# Patient Record
Sex: Male | Born: 1960 | Marital: Married | Smoking: Never smoker
Health system: Southern US, Community
[De-identification: ages and names within clinical notes are randomized; demographics above are authoritative.]

## PROBLEM LIST (undated history)

## (undated) DIAGNOSIS — U071 COVID-19: Secondary | ICD-10-CM

## (undated) DIAGNOSIS — I482 Chronic atrial fibrillation, unspecified: Secondary | ICD-10-CM

## (undated) DIAGNOSIS — J939 Pneumothorax, unspecified: Secondary | ICD-10-CM

## (undated) DIAGNOSIS — J1282 Pneumonia due to coronavirus disease 2019: Secondary | ICD-10-CM

## (undated) DIAGNOSIS — J9621 Acute and chronic respiratory failure with hypoxia: Secondary | ICD-10-CM

## (undated) DIAGNOSIS — J189 Pneumonia, unspecified organism: Secondary | ICD-10-CM

---

## 2019-05-01 ENCOUNTER — Other Ambulatory Visit (HOSPITAL_COMMUNITY): Payer: BC Managed Care – PPO

## 2019-05-01 ENCOUNTER — Inpatient Hospital Stay (HOSPITAL_COMMUNITY)
Admission: RE | Admit: 2019-05-01 | Discharge: 2019-05-17 | Disposition: A | Discharge status: Still In Hospital | Payer: BC Managed Care – PPO | Source: Other Acute Inpatient Hospital | Attending: Internal Medicine | Admitting: Internal Medicine

## 2019-05-01 DIAGNOSIS — J1282 Pneumonia due to coronavirus disease 2019: Secondary | ICD-10-CM | POA: Diagnosis present

## 2019-05-01 DIAGNOSIS — T85598A Other mechanical complication of other gastrointestinal prosthetic devices, implants and grafts, initial encounter: Secondary | ICD-10-CM

## 2019-05-01 DIAGNOSIS — J939 Pneumothorax, unspecified: Secondary | ICD-10-CM | POA: Diagnosis present

## 2019-05-01 DIAGNOSIS — Z4659 Encounter for fitting and adjustment of other gastrointestinal appliance and device: Secondary | ICD-10-CM

## 2019-05-01 DIAGNOSIS — R079 Chest pain, unspecified: Secondary | ICD-10-CM

## 2019-05-01 DIAGNOSIS — Z931 Gastrostomy status: Secondary | ICD-10-CM

## 2019-05-01 DIAGNOSIS — J9621 Acute and chronic respiratory failure with hypoxia: Secondary | ICD-10-CM | POA: Diagnosis present

## 2019-05-01 DIAGNOSIS — U071 COVID-19: Secondary | ICD-10-CM | POA: Diagnosis present

## 2019-05-01 DIAGNOSIS — J189 Pneumonia, unspecified organism: Secondary | ICD-10-CM | POA: Diagnosis present

## 2019-05-01 DIAGNOSIS — I482 Chronic atrial fibrillation, unspecified: Secondary | ICD-10-CM | POA: Diagnosis present

## 2019-05-01 DIAGNOSIS — J9690 Respiratory failure, unspecified, unspecified whether with hypoxia or hypercapnia: Secondary | ICD-10-CM

## 2019-05-01 DIAGNOSIS — Z930 Tracheostomy status: Secondary | ICD-10-CM

## 2019-05-01 DIAGNOSIS — R0603 Acute respiratory distress: Secondary | ICD-10-CM

## 2019-05-01 HISTORY — DX: Acute and chronic respiratory failure with hypoxia: J96.21

## 2019-05-01 HISTORY — DX: Pneumonia due to coronavirus disease 2019: J12.82

## 2019-05-01 HISTORY — DX: Pneumonia, unspecified organism: J18.9

## 2019-05-01 HISTORY — DX: Pneumothorax, unspecified: J93.9

## 2019-05-01 HISTORY — DX: COVID-19: U07.1

## 2019-05-01 HISTORY — DX: Chronic atrial fibrillation, unspecified: I48.20

## 2019-05-01 LAB — BLOOD GAS, ARTERIAL
Acid-Base Excess: 9.7 mmol/L — ABNORMAL HIGH (ref 0.0–2.0)
Bicarbonate: 33.1 mmol/L — ABNORMAL HIGH (ref 20.0–28.0)
FIO2: 45
O2 Saturation: 96.4 %
Patient temperature: 37
pCO2 arterial: 39 mmHg (ref 32.0–48.0)
pH, Arterial: 7.538 — ABNORMAL HIGH (ref 7.350–7.450)
pO2, Arterial: 73.2 mmHg — ABNORMAL LOW (ref 83.0–108.0)

## 2019-05-02 LAB — CBC
HCT: 28.8 % — ABNORMAL LOW (ref 39.0–52.0)
Hemoglobin: 8.8 g/dL — ABNORMAL LOW (ref 13.0–17.0)
MCH: 26.8 pg (ref 26.0–34.0)
MCHC: 30.6 g/dL (ref 30.0–36.0)
MCV: 87.8 fL (ref 80.0–100.0)
Platelets: 469 10*3/uL — ABNORMAL HIGH (ref 150–400)
RBC: 3.28 MIL/uL — ABNORMAL LOW (ref 4.22–5.81)
RDW: 18.6 % — ABNORMAL HIGH (ref 11.5–15.5)
WBC: 19.4 10*3/uL — ABNORMAL HIGH (ref 4.0–10.5)
nRBC: 0 % (ref 0.0–0.2)

## 2019-05-02 LAB — BASIC METABOLIC PANEL
Anion gap: 9 (ref 5–15)
BUN: 21 mg/dL — ABNORMAL HIGH (ref 6–20)
CO2: 31 mmol/L (ref 22–32)
Calcium: 9.1 mg/dL (ref 8.9–10.3)
Chloride: 100 mmol/L (ref 98–111)
Creatinine, Ser: 0.55 mg/dL — ABNORMAL LOW (ref 0.61–1.24)
GFR calc Af Amer: >60 mL/min (ref >60)
GFR calc non Af Amer: >60 mL/min (ref >60)
Glucose, Bld: 78 mg/dL (ref 70–99)
Potassium: 3.6 mmol/L (ref 3.5–5.1)
Sodium: 140 mmol/L (ref 135–145)

## 2019-05-03 ENCOUNTER — Other Ambulatory Visit (HOSPITAL_COMMUNITY): Payer: BC Managed Care – PPO

## 2019-05-03 ENCOUNTER — Encounter: Payer: Self-pay | Admitting: Internal Medicine

## 2019-05-03 DIAGNOSIS — J939 Pneumothorax, unspecified: Secondary | ICD-10-CM

## 2019-05-03 DIAGNOSIS — I482 Chronic atrial fibrillation, unspecified: Secondary | ICD-10-CM | POA: Diagnosis not present

## 2019-05-03 DIAGNOSIS — Z930 Tracheostomy status: Secondary | ICD-10-CM

## 2019-05-03 DIAGNOSIS — U071 COVID-19: Secondary | ICD-10-CM | POA: Diagnosis not present

## 2019-05-03 DIAGNOSIS — J1282 Pneumonia due to coronavirus disease 2019: Secondary | ICD-10-CM

## 2019-05-03 DIAGNOSIS — J9621 Acute and chronic respiratory failure with hypoxia: Secondary | ICD-10-CM | POA: Diagnosis not present

## 2019-05-03 DIAGNOSIS — J189 Pneumonia, unspecified organism: Secondary | ICD-10-CM

## 2019-05-03 NOTE — Consult Note (Signed)
Pulmonary Critical Care Medicine [facility] [place]  PULMONARY SERVICE  Date of Service: 05/03/2019  PULMONARY CRITICAL CARE CONSULT   Casey Wheeler  ZOX:[id number]  DOB: 08/28/1960   DOA: 05/01/2019  Referring Physician: Carron Curie, MD  HPI: Casey Wheeler is a 59 y.o. male seen for follow up of Acute on Chronic Respiratory Failure.  Patient has multiple medical problems including scoliosis chronic back pain COPD GERD alcohol abuse atrial fibrillation gout chronic anemia who presented to the hospital because of increasing shortness of breath.  Patient has a history of COPD and is dependent on oxygen.  Apparently he was having saturations of 77% tested for COVID-19 and was positive.  Patient's respiratory status declined ended up intubated on the ventilator.  Prolonged mechanical course with mechanical ventilation and vent dependence needing a tracheostomy.  Patient also had bilateral pneumothorax as well as pneumomediastinum.  Chest tubes were placed subsequently diagnosed with hospital-acquired pneumonia treated with meropenem and Maxipime as well as clindamycin and aztreonam.  Right now he is on pressure support mode and is appears to be tolerating it relatively well  Review of Systems:  ROS performed and is unremarkable other than noted above.  Past medical history: Scoliosis COPD Oxygen dependence GERD Alcohol abuse Chronic atrial fibrillation Gout Chronic anemia  Surgical history: Tracheostomy PEG  Social history: Positive for tobacco use Positive for alcohol use  Family history: Noncontributory to the present illness  Allergies: Reviewed on the [facility]  Medications: Reviewed on Rounds  Physical Exam:  Vitals: Temperature is 98.0 pulse 76 respiratory rate 17 blood pressure is 107/62 saturations 99%  Ventilator Settings on pressure support FiO2 30% tidal volumes 442 pressure poor 12 PEEP 5  . General: Comfortable at this time . Eyes: Grossly normal lids,  irises & conjunctiva . ENT: grossly tongue is normal . Neck: no obvious mass . Cardiovascular: S1-S2 normal no gallop or rub . Respiratory: Coarse rhonchi expansion is equal . Abdomen: Soft and nontender . Skin: no rash seen on limited exam . Musculoskeletal: not rigid . Psychiatric:unable to assess . Neurologic: no seizure no involuntary movements         Labs on Admission:  Basic Metabolic Panel: Recent Labs  Lab 05/02/19 0656  NA 140  K 3.6  CL 100  CO2 31  GLUCOSE 78  BUN 21*  CREATININE 0.55*  CALCIUM 9.1    Recent Labs  Lab 05/01/19 1845  PHART 7.538*  PCO2ART 39.0  PO2ART 73.2*  HCO3 33.1*  O2SAT 96.4    Liver Function Tests: No results for input(s): AST, ALT, ALKPHOS, BILITOT, PROT, ALBUMIN in the last 168 hours. No results for input(s): LIPASE, AMYLASE in the last 168 hours. No results for input(s): AMMONIA in the last 168 hours.  CBC: Recent Labs  Lab 05/02/19 0656  WBC 19.4*  HGB 8.8*  HCT 28.8*  MCV 87.8  PLT 469*    Cardiac Enzymes: No results for input(s): CKTOTAL, CKMB, CKMBINDEX, TROPONINI in the last 168 hours.  BNP (last 3 results) No results for input(s): BNP in the last 8760 hours.  ProBNP (last 3 results) No results for input(s): PROBNP in the last 8760 hours.   Radiological Exams on Admission: DG CHEST PORT 1 VIEW  Result Date: 05/01/2019 CLINICAL DATA:  Tracheostomy status EXAM: PORTABLE CHEST 1 VIEW COMPARISON:  None. FINDINGS: Tracheostomy projects over the mid trachea. Heart is upper limits normal in size. Bilateral interstitial/airspace opacities could reflect edema or infection. Elevation of the left hemidiaphragm. Postoperative changes in  the thoracic spine. IMPRESSION: Tracheostomy projects over the mid trachea. Interstitial and airspace opacities diffusely could reflect edema or infection. Electronically Signed   By: Rolm Baptise M.D.   On: 05/01/2019 21:20   DG Abd Portable 1V  Result Date: 05/01/2019 CLINICAL DATA:   Tracheostomy status EXAM: PORTABLE ABDOMEN - 1 VIEW COMPARISON:  None. FINDINGS: Feeding tube coils in the stomach. Nonobstructive bowel gas pattern. IMPRESSION: Feeding tube coils in the stomach. Electronically Signed   By: Rolm Baptise M.D.   On: 05/01/2019 21:19    Assessment/Plan Active Problems:   Acute on chronic respiratory failure with hypoxia (HCC)   Chronic atrial fibrillation ([place])   COVID-19 virus infection   Pneumonia due to COVID-19 virus   Bilateral pneumothorax   Healthcare-associated pneumonia   1. Acute on chronic respiratory failure with hypoxia patient right now is weaning on protocol on pressure support right now is on 30% FiO2 with a tidal volume of 442 pressure support 12 PEEP 5 we will continue to advance the wean as tolerated continue secretion management pulmonary toilet 2. Healthcare associated pneumonia apparently cultured with Pseudomonas treated with clindamycin and aztreonam right now and infectious disease currently following the patient along. 3. COVID-19 virus infection in resolution phase we will continue to monitor closely. 4. COVID-19 pneumonia treated patient has residual changes noted on the x-ray with interstitial airspace opacities. 5. Bilateral pneumothoraces resolved and chest tubes were removed we will continue to monitor closely 6. Chronic atrial fibrillation rate now rate controlled  I have personally seen and evaluated the patient, evaluated laboratory and imaging results, formulated the assessment and plan and placed orders. The Patient requires high complexity decision making with multiple systems involvement.  Case was discussed on Rounds with the Respiratory Therapy Director and the Respiratory staff Time Spent 71minutes  Donte Kary A Donnajean Chesnut, MD [facility] Pulmonary Critical Care Medicine Sleep Medicine

## 2019-05-04 DIAGNOSIS — J9621 Acute and chronic respiratory failure with hypoxia: Secondary | ICD-10-CM | POA: Diagnosis not present

## 2019-05-04 DIAGNOSIS — J939 Pneumothorax, unspecified: Secondary | ICD-10-CM | POA: Diagnosis not present

## 2019-05-04 DIAGNOSIS — U071 COVID-19: Secondary | ICD-10-CM | POA: Diagnosis not present

## 2019-05-04 DIAGNOSIS — I482 Chronic atrial fibrillation, unspecified: Secondary | ICD-10-CM | POA: Diagnosis not present

## 2019-05-04 NOTE — Progress Notes (Signed)
Pulmonary Critical Care Medicine [place]   PULMONARY CRITICAL CARE SERVICE  PROGRESS NOTE  Date of Service: 05/04/2019  Willey Due  OVF:[id number]  DOB: October 31, 1960   DOA: 05/01/2019  Referring Physician: Merton Border, MD  HPI: Casey Wheeler is a 59 y.o. male seen for follow up of Acute on Chronic Respiratory Failure.  Patient has been weaning on the nag device doing relatively well has been on 2 L of oxygen completed 2 hours  Medications: Reviewed on Rounds  Physical Exam:  Vitals: Temperature 98.6 pulse 88 respiratory rate 20 blood pressure 114/66 saturations 97%  Ventilator Settings off the ventilator on the NAG  . General: Comfortable at this time . Eyes: Grossly normal lids, irises & conjunctiva . ENT: grossly tongue is normal . Neck: no obvious mass . Cardiovascular: S1 S2 normal no gallop . Respiratory: No rhonchi no rales are noted at this time . Abdomen: soft . Skin: no rash seen on limited exam . Musculoskeletal: not rigid . Psychiatric:unable to assess . Neurologic: no seizure no involuntary movements         Lab Data:   Basic Metabolic Panel: Recent Labs  Lab 05/02/19 0656  NA 140  K 3.6  CL 100  CO2 31  GLUCOSE 78  BUN 21*  CREATININE 0.55*  CALCIUM 9.1    ABG: Recent Labs  Lab 05/01/19 1845  PHART 7.538*  PCO2ART 39.0  PO2ART 73.2*  HCO3 33.1*  O2SAT 96.4    Liver Function Tests: No results for input(s): AST, ALT, ALKPHOS, BILITOT, PROT, ALBUMIN in the last 168 hours. No results for input(s): LIPASE, AMYLASE in the last 168 hours. No results for input(s): AMMONIA in the last 168 hours.  CBC: Recent Labs  Lab 05/02/19 0656  WBC 19.4*  HGB 8.8*  HCT 28.8*  MCV 87.8  PLT 469*    Cardiac Enzymes: No results for input(s): CKTOTAL, CKMB, CKMBINDEX, TROPONINI in the last 168 hours.  BNP (last 3 results) No results for input(s): BNP in the last 8760 hours.  ProBNP (last 3 results) No results for input(s):  PROBNP in the last 8760 hours.  Radiological Exams: DG Abd Portable 1V  Result Date: 05/03/2019 CLINICAL DATA:  NG tube repositioned EXAM: PORTABLE ABDOMEN - 1 VIEW COMPARISON:  05/03/2019 FINDINGS: NG tube tip is in the mid stomach. Nonobstructive bowel gas pattern. IMPRESSION: NG tube tip in the mid stomach. Electronically Signed   By: Rolm Baptise M.D.   On: 05/03/2019 22:14   DG Abd Portable 1V  Result Date: 05/03/2019 CLINICAL DATA:  59 year old male with NG placement. EXAM: PORTABLE ABDOMEN - 1 VIEW COMPARISON:  Abdominal radiograph dated 05/01/2019. FINDINGS: An enteric tube is noted with tip in the left upper abdomen over the gastric air. Nonobstructing bowel gas and extensive lumbar spine surgical changes. IMPRESSION: Enteric tube with tip in the left upper abdomen in the stomach. Electronically Signed   By: Anner Crete M.D.   On: 05/03/2019 17:13    Assessment/Plan Active Problems:   Acute on chronic respiratory failure with hypoxia (HCC)   Chronic atrial fibrillation ([place])   COVID-19 virus infection   Pneumonia due to COVID-19 virus   Bilateral pneumothorax   Healthcare-associated pneumonia   1. Acute on chronic respiratory failure with hypoxia continue to wean off the ventilator on the NAG patient is tolerating it fairly well 2. Chronic atrial fibrillation rate controlled at this time 3. COVID-19 virus infection in resolution phase 4. Pneumonia due to COVID-19 treated 5.  Bilateral pneumothorax resolved 6. Healthcare associated pneumonia slowly improving we will continue with present management   I have personally seen and evaluated the patient, evaluated laboratory and imaging results, formulated the assessment and plan and placed orders. The Patient requires high complexity decision making with multiple systems involvement.  Rounds were done with the Respiratory Therapy Director and Staff therapists and discussed with nursing staff also.  Yevonne Pax, MD  [facility] Pulmonary Critical Care Medicine Sleep Medicine

## 2019-05-05 DIAGNOSIS — U071 COVID-19: Secondary | ICD-10-CM | POA: Diagnosis not present

## 2019-05-05 DIAGNOSIS — I482 Chronic atrial fibrillation, unspecified: Secondary | ICD-10-CM | POA: Diagnosis not present

## 2019-05-05 DIAGNOSIS — J9621 Acute and chronic respiratory failure with hypoxia: Secondary | ICD-10-CM | POA: Diagnosis not present

## 2019-05-05 DIAGNOSIS — J939 Pneumothorax, unspecified: Secondary | ICD-10-CM | POA: Diagnosis not present

## 2019-05-05 NOTE — Progress Notes (Addendum)
Pulmonary Critical Care Medicine [facility] [place]   PULMONARY CRITICAL CARE SERVICE  PROGRESS NOTE  Date of Service: 05/05/2019  Casey Wheeler  [id number]  DOB: [birth year]   DOA: 05/01/2019  Referring Physician: Carron Curie, MD  HPI: Casey Wheeler is a 59 y.o. male seen for follow up of Acute on Chronic Respiratory Failure.  Patient was able to tolerate NAG today is now back on pressure support tolerated 12/5 FiO2 30%  Medications: Reviewed on Rounds  Physical Exam:  Vitals: Pulse 81 respirations 28 BP 106/68 O2 78% temp 98.2  Ventilator Settings per support 12/5 FiO2 30%  . General: Comfortable at this time . Eyes: Grossly normal lids, irises & conjunctiva . ENT: grossly tongue is normal . Neck: no obvious mass . Cardiovascular: S1 S2 normal no gallop . Respiratory: No rales or rhonchi noted . Abdomen: soft . Skin: no rash seen on limited exam . Musculoskeletal: not rigid . Psychiatric:unable to assess . Neurologic: no seizure no involuntary movements         Lab Data:   Basic Metabolic Panel: Recent Labs  Lab 05/02/19 0656  NA 140  K 3.6  CL 100  CO2 31  GLUCOSE 78  BUN 21*  CREATININE 0.55*  CALCIUM 9.1    ABG: Recent Labs  Lab 05/01/19 1845  PHART 7.538*  PCO2ART 39.0  PO2ART 73.2*  HCO3 33.1*  O2SAT 96.4    Liver Function Tests: No results for input(s): AST, ALT, ALKPHOS, BILITOT, PROT, ALBUMIN in the last 168 hours. No results for input(s): LIPASE, AMYLASE in the last 168 hours. No results for input(s): AMMONIA in the last 168 hours.  CBC: Recent Labs  Lab 05/02/19 0656  WBC 19.4*  HGB 8.8*  HCT 28.8*  MCV 87.8  PLT 469*    Cardiac Enzymes: No results for input(s): CKTOTAL, CKMB, CKMBINDEX, TROPONINI in the last 168 hours.  BNP (last 3 results) No results for input(s): BNP in the last 8760 hours.  ProBNP (last 3 results) No results for input(s): PROBNP in the last 8760 hours.  Radiological Exams: DG Abd  Portable 1V  Result Date: 05/03/2019 CLINICAL DATA:  NG tube repositioned EXAM: PORTABLE ABDOMEN - 1 VIEW COMPARISON:  05/03/2019 FINDINGS: NG tube tip is in the mid stomach. Nonobstructive bowel gas pattern. IMPRESSION: NG tube tip in the mid stomach. Electronically Signed   By: Charlett Nose M.D.   On: 05/03/2019 22:14    Assessment/Plan Active Problems:   Acute on chronic respiratory failure with hypoxia (HCC)   Chronic atrial fibrillation (HCC)   COVID-19 virus infection   Pneumonia due to COVID-19 virus   Bilateral pneumothorax   Healthcare-associated pneumonia   1. Acute on chronic respiratory failure with hypoxia continue to wean patient as tolerated.  Continue aggressive pulmonary toilet supportive measures. 2. Chronic atrial fibrillation rate controlled at this time 3. COVID-19 virus infection in resolution phase 4. Pneumonia due to COVID-19 treated 5. Bilateral pneumothorax resolved 6. Healthcare associated pneumonia slowly improving we will continue with present management   I have personally seen and evaluated the patient, evaluated laboratory and imaging results, formulated the assessment and plan and placed orders. The Patient requires high complexity decision making with multiple systems involvement.  Rounds were done with the Respiratory Therapy Director and Staff therapists and discussed with nursing staff also.  Yevonne Pax, MD [facility] Pulmonary Critical Care Medicine Sleep Medicine

## 2019-05-06 ENCOUNTER — Other Ambulatory Visit (HOSPITAL_COMMUNITY): Payer: BC Managed Care – PPO

## 2019-05-06 DIAGNOSIS — J939 Pneumothorax, unspecified: Secondary | ICD-10-CM | POA: Diagnosis not present

## 2019-05-06 DIAGNOSIS — I482 Chronic atrial fibrillation, unspecified: Secondary | ICD-10-CM | POA: Diagnosis not present

## 2019-05-06 DIAGNOSIS — J9621 Acute and chronic respiratory failure with hypoxia: Secondary | ICD-10-CM | POA: Diagnosis not present

## 2019-05-06 DIAGNOSIS — U071 COVID-19: Secondary | ICD-10-CM | POA: Diagnosis not present

## 2019-05-06 LAB — BLOOD GAS, ARTERIAL
Acid-Base Excess: 6.8 mmol/L — ABNORMAL HIGH (ref 0.0–2.0)
Bicarbonate: 30.3 mmol/L — ABNORMAL HIGH (ref 20.0–28.0)
FIO2: 70
O2 Saturation: 94.7 %
Patient temperature: 36.7
pCO2 arterial: 39.5 mmHg (ref 32.0–48.0)
pH, Arterial: 7.496 — ABNORMAL HIGH (ref 7.350–7.450)
pO2, Arterial: 74.9 mmHg — ABNORMAL LOW (ref 83.0–108.0)

## 2019-05-06 NOTE — Consult Note (Signed)
Infectious Disease Consultation   Casey Wheeler  DDU:[id number]  DOB: 1960-10-20  DOA: 05/01/2019  Requesting physician: Dr.Brown  Reason for consultation: Antibiotic recommendations   History of Present Illness: Casey Wheeler is an 59 y.o. male who was initially admitted to [facility] on 01/01/2019 with worsening shortness of breath and cough that started 3 days prior to admission.  He has multiple medical problems including history of COPD on oxygen, scoliosis, chronic back pain, GERD, alcohol abuse, atrial fibrillation, gout, chronic anemia, hypertension, Wernicke's encephalopathy, paroxysmal atrial fibrillation, hyperlipidemia, GERD, depression.  In the ER he was found to have oxygen saturation of 77% on room air.  He was found to have COVID-19 infection.  During his hospital stay his oxygenation worsened and he was intubated on 03/08/2019.  He had a prolonged course on the vent but was eventually extubated on 03/21/2019 but reintubated on 03/22/2019.  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  Patient hospital course complicated by pneumomediastinum and bilateral pneumothorax in the first week of January requiring bilateral chest tubes.  He had tracheal aspirate that was growing Pseudomonas.  He was initially placed on meropenem and then changed to Maxipime and aminoglycoside.  He was then switched to clindamycin, aztreonam per infectious disease on 04/30/2019.  Due to his complex medical problems he was transferred here for further care.  He is now having leukocytosis, fever.  Chest x-ray per report significantly worsened volume loss in the left hemithorax with near complete opacification of the left hemithorax.  Likely central left airway mucoid impaction with near complete left lung atelectasis/pneumonia.  Hazy right lung opacities.  He has a trach in place and is is nonverbal but nods when asked questions.  He is complaining of shortness of breath.  Denies having any chest pain, nausea,  vomiting, abdominal pain, diarrhea or dysuria.  He is currently on 100% FiO2, PEEP of 7.   Review of Systems:  He is nonverbal but nods when asked questions.  Review of systems negative except as mentioned above.   Past Medical History: Past Medical History:  Diagnosis Date  . Acute on chronic respiratory failure with hypoxia (HCC)   . Bilateral pneumothorax   . Chronic atrial fibrillation (HCC)   . COVID-19 virus infection   . Healthcare-associated pneumonia   . Pneumonia due to COVID-19 virus   Scoliosis, chronic back pain, COPD, GERD, alcohol abuse, atrial fibrillation, gout, Wernicke's disease, anemia  Past Surgical History: Tracheostomy, PEG  Allergies: No known drug allergies  Social History: History of tobacco abuse, alcohol abuse   Family History: Per records both parents are deceased.  Unable to obtain family history at this time.   Physical Exam: Vitals: Temperature 98.8, pulse 86, blood pressure 110/71, respiratory rate 32, 100% FiO2 with PEEP of 7 General: Thin, ill-appearing male, awake, on vent with 100% FiO2 Eyes: PERLA, EOMI, has NG tube in place ENMT: external ears and nose appear normal, Lips appears normal Neck: Has trach in place CVS: S1-S2 clear, no murmur Respiratory: Decreased breath sound lower lobes more on the left side, rhonchi, occasional wheezing Abdomen: soft nontender, nondistended, normal bowel sounds  Musculoskeletal: : no edema, no contractures Neuro: He is able to move all extremities, following few commands, appears to have debility with generalized weakness Psych: stable mood and affect, mental status Skin: no rashes  Data reviewed:  I have personally reviewed following labs and imaging studies Labs:  CBC: Recent Labs  Lab 05/02/19 0656  WBC 19.4*  HGB 8.8*  HCT  28.8*  MCV 87.8  PLT 469*    Basic Metabolic Panel: Recent Labs  Lab 05/02/19 0656  NA 140  K 3.6  CL 100  CO2 31  GLUCOSE 78  BUN 21*  CREATININE 0.55*   CALCIUM 9.1   GFR CrCl cannot be calculated (Unknown ideal weight.). Liver Function Tests: No results for input(s): AST, ALT, ALKPHOS, BILITOT, PROT, ALBUMIN in the last 168 hours. No results for input(s): LIPASE, AMYLASE in the last 168 hours. No results for input(s): AMMONIA in the last 168 hours. Coagulation profile No results for input(s): INR, PROTIME in the last 168 hours.  Cardiac Enzymes: No results for input(s): CKTOTAL, CKMB, CKMBINDEX, TROPONINI in the last 168 hours. BNP: Invalid input(s): POCBNP CBG: No results for input(s): GLUCAP in the last 168 hours. D-Dimer No results for input(s): DDIMER in the last 72 hours. Hgb A1c No results for input(s): HGBA1C in the last 72 hours. Lipid Profile No results for input(s): CHOL, HDL, LDLCALC, TRIG, CHOLHDL, LDLDIRECT in the last 72 hours. Thyroid function studies No results for input(s): TSH, T4TOTAL, T3FREE, THYROIDAB in the last 72 hours.  Invalid input(s): FREET3 Anemia work up No results for input(s): VITAMINB12, FOLATE, FERRITIN, TIBC, IRON, RETICCTPCT in the last 72 hours. Urinalysis No results found for: COLORURINE, APPEARANCEUR, LABSPEC, [place], GLUCOSEU, HGBUR, BILIRUBINUR, KETONESUR, PROTEINUR, UROBILINOGEN, NITRITE, [place]   Microbiology No results found for this or any previous visit (from the past 240 hour(s)).     Inpatient Medications:   Scheduled Meds: Please see MAR   Radiological Exams on Admission: DG Chest Port 1 View  Result Date: 05/06/2019 CLINICAL DATA:  Respiratory distress EXAM: PORTABLE CHEST 1 VIEW COMPARISON:  05/01/2019 chest radiograph. FINDINGS: Tracheostomy tube tip overlies the tracheal air column at the level of the thoracic inlet. Enteric tube enters stomach with the tip not seen on this image. Stable cardiomediastinal silhouette with normal heart size. No pneumothorax. No right pleural effusion. Interval significant volume loss in the left hemithorax with near complete  opacification of the left hemithorax, significantly worsened. Decreased hazy right lung opacities. IMPRESSION: 1. Significantly worsened volume loss in the left hemithorax with near complete opacification of the left hemithorax. Findings suggest central left airway mucoid impaction with near complete left lung atelectasis and/or pneumonia. 2. Decreased hazy right lung opacities. Electronically Signed   By: Ilona Sorrel M.D.   On: 05/06/2019 09:06    Impression/Recommendations Active Problems:   Acute on chronic respiratory failure with hypoxia (HCC)   Chronic atrial fibrillation ([place])   COVID-19 virus infection   Pneumonia due to COVID-19 virus   Bilateral pneumothorax   Healthcare-associated pneumonia Ventilator dependent respiratory failure COPD Dysphagia Protein calorie malnutrition History of alcohol abuse Wernicke's encephalopathy Depression/anxiety  Acute on chronic respiratory failure with hypoxia: Patient now with worsening respiratory failure.  Currently on 100% FiO2, PEEP of 7.  Pulmonary following.  Likely multifactorial.  He has COPD and recently had COVID-19 pneumonia and healthcare associated pneumonia.  His respiratory cultures at the outside facility showed Pseudomonas.  We do not have any cultures from here.  Would recommend to order respiratory cultures.  He is on treatment with clindamycin.  Would recommend to discontinue the clindamycin and switch to cefepime and recommend to add Flagyl for anaerobic coverage as well.  There is very high suspicion for ongoing aspiration/mucous plugging.  Chest x-ray from today showing significantly worsened volume loss in the left hemithorax with near complete opacification of the left hemithorax.  Suggest chest CT to  better evaluate. He may need bronchoscopy with clearing of mucoid impaction.  Continue aggressive pulmonary toileting.  Follow-up on respiratory cultures and adjust antibiotics accordingly.  COVID-19 infection with pneumonia: He  received convalescent plasma at the outside facility.  Remdesivir was not given secondary to elevated LFTs.  Continue supportive management per primary team.  Healthcare associated pneumonia: Respiratory cultures reportedly showed Pseudomonas at the outside facility.  On clindamycin/Azactam.  Would recommend to discontinue both of these because of his worsening respiratory failure and switch to cefepime and Flagyl.  As mentioned above may need CT to better evaluate.  A chest x-ray showing near complete opacification of the left hemithorax, high concern for mucoid impaction.  He may end up needing bronchoscopy with clearing of the mucoid impaction.  Pulmonary following.  Leukocytosis: Likely secondary to pneumonia.  Antibiotics and plan as mentioned above.  Continue to monitor counts.  Bilateral pneumothorax: He is status post chest tubes.  Currently chest tubes removed.  Paroxysmal atrial fibrillation: On amiodarone and diltiazem.  Continue medications per primary team.  Protein calorie malnutrition/dysphagia: On tube feeds per NG tube.  Unfortunately due to his dysphagia he is very high risk for aspiration and worsening respiratory failure secondary to aspiration pneumonia.  History of alcoholism/Wernicke's: Continue medications per primary team.  Depression/anxiety: Continue medication and management per primary team.  Unfortunately due to his complex medical problems he is very high risk for worsening and decompensation.  Plan of care discussed with the primary team.  Thank you for this consultation.    Vonzella Nipple M.D. 05/06/2019, 4:39 PM

## 2019-05-06 NOTE — Progress Notes (Signed)
Pulmonary Critical Care Medicine [facility] [place]   PULMONARY CRITICAL CARE SERVICE  PROGRESS NOTE  Date of Service: 05/06/2019  Casey Wheeler  JME:[id number]  DOB: July 11, 1960   DOA: 05/01/2019  Referring Physician: Carron Curie, MD  HPI: Casey Wheeler is a 59 y.o. male seen for follow up of Acute on Chronic Respiratory Failure.  Patient currently is on nasal full support on the ventilator had a decline in status apparently overnight not clear as to what happened no chest x-ray and no ABG was ordered.  This morning I found the patient on 70% FiO2 with a PEEP of 7.  Not clear if there was mucus plugging.  Patient is awake.  Medications: Reviewed on Rounds  Physical Exam:  Vitals: Temperature 98.1 pulse 86 respiratory rate 32 blood pressure 110/71 saturations 96%  Ventilator Settings mode of ventilation assist control FiO2 70% tidal volume 584 PEEP 7  . General: Comfortable at this time . Eyes: Grossly normal lids, irises & conjunctiva . ENT: grossly tongue is normal . Neck: no obvious mass . Cardiovascular: S1 S2 normal no gallop . Respiratory: Scattered coarse breath sounds with a few rhonchi . Abdomen: soft . Skin: no rash seen on limited exam . Musculoskeletal: not rigid . Psychiatric:unable to assess . Neurologic: no seizure no involuntary movements         Lab Data:   Basic Metabolic Panel: Recent Labs  Lab 05/02/19 0656  NA 140  K 3.6  CL 100  CO2 31  GLUCOSE 78  BUN 21*  CREATININE 0.55*  CALCIUM 9.1    ABG: Recent Labs  Lab 05/01/19 1845  PHART 7.538*  PCO2ART 39.0  PO2ART 73.2*  HCO3 33.1*  O2SAT 96.4    Liver Function Tests: No results for input(s): AST, ALT, ALKPHOS, BILITOT, PROT, ALBUMIN in the last 168 hours. No results for input(s): LIPASE, AMYLASE in the last 168 hours. No results for input(s): AMMONIA in the last 168 hours.  CBC: Recent Labs  Lab 05/02/19 0656  WBC 19.4*  HGB 8.8*  HCT 28.8*  MCV 87.8  PLT 469*     Cardiac Enzymes: No results for input(s): CKTOTAL, CKMB, CKMBINDEX, TROPONINI in the last 168 hours.  BNP (last 3 results) No results for input(s): BNP in the last 8760 hours.  ProBNP (last 3 results) No results for input(s): PROBNP in the last 8760 hours.  Radiological Exams: No results found.  Assessment/Plan Active Problems:   Acute on chronic respiratory failure with hypoxia (HCC)   Chronic atrial fibrillation (HCC)   COVID-19 virus infection   Pneumonia due to COVID-19 virus   Bilateral pneumothorax   Healthcare-associated pneumonia   1. Acute on chronic respiratory failure with hypoxia the patient right now is on 70% FiO2 saturations are around 96% however with the acute decline in status I recommended getting a chest x-ray as well as an ABG.  Will review that if looks like things are improving then will try to resume the weaning again. 2. Chronic atrial fibrillation right now the rate is controlled there is no sense of tachycardia noted. 3. COVID-19 virus infection treated in resolution phase we will continue with supportive care 4. Pneumonia due to COVID-19 this has been treated with residual deficits noted on the last chest film 5. Bilateral pneumothoraces patient has had this previously and is status post chest tube placement again chest x-ray ordered to make sure there is no pneumothorax 6. Healthcare associated pneumonia treated   I have personally seen and evaluated  the patient, evaluated laboratory and imaging results, formulated the assessment and plan and placed orders. The Patient requires high complexity decision making with multiple systems involvement.  Rounds were done with the Respiratory Therapy Director and Staff therapists and discussed with nursing staff also.  Time 35 minutes acute change in status  Allyne Gee, MD [facility] Pulmonary Critical Care Medicine Sleep Medicine

## 2019-05-07 ENCOUNTER — Other Ambulatory Visit (HOSPITAL_COMMUNITY): Payer: BC Managed Care – PPO

## 2019-05-07 DIAGNOSIS — J9621 Acute and chronic respiratory failure with hypoxia: Secondary | ICD-10-CM | POA: Diagnosis not present

## 2019-05-07 DIAGNOSIS — U071 COVID-19: Secondary | ICD-10-CM | POA: Diagnosis not present

## 2019-05-07 DIAGNOSIS — J939 Pneumothorax, unspecified: Secondary | ICD-10-CM | POA: Diagnosis not present

## 2019-05-07 DIAGNOSIS — I482 Chronic atrial fibrillation, unspecified: Secondary | ICD-10-CM | POA: Diagnosis not present

## 2019-05-07 LAB — CBC
HCT: 32.5 % — ABNORMAL LOW (ref 39.0–52.0)
Hemoglobin: 10 g/dL — ABNORMAL LOW (ref 13.0–17.0)
MCH: 26.6 pg (ref 26.0–34.0)
MCHC: 30.8 g/dL (ref 30.0–36.0)
MCV: 86.4 fL (ref 80.0–100.0)
Platelets: 551 10*3/uL — ABNORMAL HIGH (ref 150–400)
RBC: 3.76 MIL/uL — ABNORMAL LOW (ref 4.22–5.81)
RDW: 18.1 % — ABNORMAL HIGH (ref 11.5–15.5)
WBC: 21.8 10*3/uL — ABNORMAL HIGH (ref 4.0–10.5)
nRBC: 0 % (ref 0.0–0.2)

## 2019-05-07 LAB — BASIC METABOLIC PANEL
Anion gap: 11 (ref 5–15)
BUN: 30 mg/dL — ABNORMAL HIGH (ref 6–20)
CO2: 25 mmol/L (ref 22–32)
Calcium: 9.2 mg/dL (ref 8.9–10.3)
Chloride: 103 mmol/L (ref 98–111)
Creatinine, Ser: 0.62 mg/dL (ref 0.61–1.24)
GFR calc Af Amer: >60 mL/min (ref >60)
GFR calc non Af Amer: >60 mL/min (ref >60)
Glucose, Bld: 222 mg/dL — ABNORMAL HIGH (ref 70–99)
Potassium: 3.8 mmol/L (ref 3.5–5.1)
Sodium: 139 mmol/L (ref 135–145)

## 2019-05-07 NOTE — Progress Notes (Signed)
Request to IR for possible percutaneous gastrostomy placement -- CT A/P w/o contrast performed today which notes that anatomy is NOT amenable to percutaneous gastrostomy placement, surgical consult recommended if g-tube is still needed. Order for IR gastrostomy placement will be cancelled.  Additional incidental findings of left hydropneumothorax and age indeterminate left retroperitoneal hematoma were communicated to Dr. Luna Kitchens by this writer at 1300 today. Recommend serial H&Hs/monitoring hemodynamic stability.  IR remains available as needed - please call with questions or concerns.  Lynnette Caffey, PA-C

## 2019-05-07 NOTE — Progress Notes (Addendum)
Pulmonary Critical Care Medicine [facility] [place]   PULMONARY CRITICAL CARE SERVICE  PROGRESS NOTE  Date of Service: 05/07/2019  Casey Wheeler  SVX:[id number]  DOB: [birth year]   DOA: 05/01/2019  Referring Physician: Carron Curie, MD  HPI: Casey Wheeler is a 58 y.o. male seen for follow up of Acute on Chronic Respiratory Failure.  Patient remains on full support at this time expressed  Medications: Reviewed on Rounds  Physical Exam:  Vitals: Pulse 93 respirations 30 BP 109/60 O2 sat 98% temp 98.3  Ventilator Settings ventilator mode AC VC rate 12 tidal volume 500 PEEP of 7 with FiO2 of 65%  . General: Comfortable at this time . Eyes: Grossly normal lids, irises & conjunctiva . ENT: grossly tongue is normal . Neck: no obvious mass . Cardiovascular: S1 S2 normal no gallop . Respiratory: No rales or rhonchi noted . Abdomen: soft . Skin: no rash seen on limited exam . Musculoskeletal: not rigid . Psychiatric:unable to assess . Neurologic: no seizure no involuntary movements         Lab Data:   Basic Metabolic Panel: Recent Labs  Lab 05/02/19 0656 05/07/19 0505  NA 140 139  K 3.6 3.8  CL 100 103  CO2 31 25  GLUCOSE 78 222*  BUN 21* 30*  CREATININE 0.55* 0.62  CALCIUM 9.1 9.2    ABG: Recent Labs  Lab 05/01/19 1845 05/06/19 0834  PHART 7.538* 7.496*  PCO2ART 39.0 39.5  PO2ART 73.2* 74.9*  HCO3 33.1* 30.3*  O2SAT 96.4 94.7    Liver Function Tests: No results for input(s): AST, ALT, ALKPHOS, BILITOT, PROT, ALBUMIN in the last 168 hours. No results for input(s): LIPASE, AMYLASE in the last 168 hours. No results for input(s): AMMONIA in the last 168 hours.  CBC: Recent Labs  Lab 05/02/19 0656 05/07/19 0505  WBC 19.4* 21.8*  HGB 8.8* 10.0*  HCT 28.8* 32.5*  MCV 87.8 86.4  PLT 469* 551*    Cardiac Enzymes: No results for input(s): CKTOTAL, CKMB, CKMBINDEX, TROPONINI in the last 168 hours.  BNP (last 3 results) No results for input(s): BNP  in the last 8760 hours.  ProBNP (last 3 results) No results for input(s): PROBNP in the last 8760 hours.  Radiological Exams: CT ABDOMEN WO CONTRAST  Result Date: 05/07/2019 CLINICAL DATA:  Evaluate anatomy prior to potential percutaneous gastrostomy tube placement. EXAM: CT ABDOMEN WITHOUT CONTRAST TECHNIQUE: Multidetector CT imaging of the abdomen was performed following the standard protocol without IV contrast. COMPARISON:  None. FINDINGS: The lack of intravenous contrast limits the ability to evaluate solid abdominal organs. Lower chest: Limited visualization of the lower thorax demonstrates an age-indeterminate hydropneumothorax involving the medial aspect of the imaged left lung base (representative image 1, series 3). Note, the superior most aspect of the hydropneumothorax is not imaged. Consolidative opacities with associated air bronchograms within the bilateral lung bases, left greater than right. Advanced emphysematous change. Normal heart size. Coronary artery calcifications. Trace amount of pericardial fluid, presumably physiologic. Hepatobiliary: Normal hepatic contour. Note is made of a punctate (approximately 0.9 cm) stone lying dependently within the neck of an otherwise normal-appearing gallbladder. No ascites. Pancreas: Normal noncontrast appearance of the pancreas. Spleen: The spleen is diminutive and dysmorphic in appearance, potentially posttraumatic in etiology. Adrenals/Urinary Tract: Normal noncontrast appearance of the bilateral kidneys. No renal stones. No renal stones are seen along expected course of either ureter. No urinary obstruction or perinephric stranding. Normal noncontrast appearance the bilateral adrenal glands. The urinary bladder was not  imaged. Stomach/Bowel: Enteric tube tip terminates within the gastric antrum. Both hepatic parenchyma as well as the transverse colon are interposed between the anterior wall the stomach and ventral wall of the abdomen, precluding  percutaneous gastrostomy tube placement. Scattered colonic diverticulosis without evidence of superimposed acute diverticulitis. No pneumoperitoneum, pneumatosis or portal venous gas. Vascular/Lymphatic: Atherosclerotic plaque within normal caliber abdominal aorta. No bulky retroperitoneal or mesenteric adenopathy on this noncontrast examination. Other: Note is made of an age-indeterminate retroperitoneal hematoma affecting the left iliacus musculature (image 62, series 3). Musculoskeletal: Post long segment thoracolumbar paraspinal fusion and post intervertebral disc space replacement of L2-L3, L3-L4, L4-L5 and L5-S1. No definite evidence of hardware failure or loosening. IMPRESSION: 1. Gastric anatomy not amenable to percutaneous gastrostomy tube placement due to interposition of the hepatic parenchyma and transverse colon. If gastrostomy tube placement is still required, recommend surgical consultation. 2. Age-indeterminate hydropneumothorax within the imaged medial aspect the left lung base. 3. Bibasilar consolidative opacities with associated air bronchograms, left greater than right, potentially atelectasis though worrisome for multifocal infection/aspiration. 4. Age-indeterminate though potentially chronic left-sided retroperitoneal hematoma affecting the left iliacus musculature. Correlation with hemodynamic stability and serial H&Hs could be performed as indicated. 5. Post long segment thoracolumbar paraspinal fusion and multilevel intervertebral disc space replacement without evidence of hardware failure or loosening 6. Coronary calcifications.  Aortic Atherosclerosis (ICD10-I70.0). 7. Cholelithiasis without evidence of cholecystitis on this noncontrast examination. Critical Value/emergent results were called by telephone at the time of interpretation on 05/07/2019 at 12:48 pm to provider [facility] , who verbally acknowledged these results. Electronically Signed   By: Sandi Mariscal M.D.   On:  05/07/2019 12:59   DG Chest Port 1 View  Result Date: 05/07/2019 CLINICAL DATA:  Respiratory failure EXAM: PORTABLE CHEST 1 VIEW COMPARISON:  05/05/2018 FINDINGS: Cardiac shadow is stable. Gastric catheter tracheostomy tube are noted in satisfactory position. Elevation of the left hemidiaphragm is again noted. Left basilar atelectasis is seen. The overall degree of aeration on the left has improved significantly in the interval from the prior exam. Old rib fractures are noted on the right. Surgical hardware is noted throughout the thoracolumbar spine. IMPRESSION: Tubes and lines as described above. Left basilar atelectasis with significant improved aeration on the left when compared with the prior exam. Electronically Signed   By: Inez Catalina M.D.   On: 05/07/2019 09:38   DG Chest Port 1 View  Result Date: 05/06/2019 CLINICAL DATA:  Respiratory distress EXAM: PORTABLE CHEST 1 VIEW COMPARISON:  05/01/2019 chest radiograph. FINDINGS: Tracheostomy tube tip overlies the tracheal air column at the level of the thoracic inlet. Enteric tube enters stomach with the tip not seen on this image. Stable cardiomediastinal silhouette with normal heart size. No pneumothorax. No right pleural effusion. Interval significant volume loss in the left hemithorax with near complete opacification of the left hemithorax, significantly worsened. Decreased hazy right lung opacities. IMPRESSION: 1. Significantly worsened volume loss in the left hemithorax with near complete opacification of the left hemithorax. Findings suggest central left airway mucoid impaction with near complete left lung atelectasis and/or pneumonia. 2. Decreased hazy right lung opacities. Electronically Signed   By: Ilona Sorrel M.D.   On: 05/06/2019 09:06    Assessment/Plan Active Problems:   Acute on chronic respiratory failure with hypoxia (HCC)   Chronic atrial fibrillation ([place])   COVID-19 virus infection   Pneumonia due to COVID-19 virus    Bilateral pneumothorax   Healthcare-associated pneumonia   1. Acute on chronic respiratory failure  with hypoxia patient requiring 65% FiO2 satting well at this time continue aggressive pulmonary toilet supportive measures and continue to attempt weaning FiO2 as possible. 2. Chronic atrial fibrillation right now the rate is controlled there is no sense of tachycardia noted. 3. COVID-19 virus infection treated in resolution phase we will continue with supportive care 4. Pneumonia due to COVID-19 this has been treated with residual deficits noted on the last chest film 5. Bilateral pneumothoraces patient has had this previously and is status post chest tube placement again chest x-ray ordered to make sure there is no pneumothorax 6. Healthcare associated pneumonia treated   I have personally seen and evaluated the patient, evaluated laboratory and imaging results, formulated the assessment and plan and placed orders. The Patient requires high complexity decision making with multiple systems involvement.  Rounds were done with the Respiratory Therapy Director and Staff therapists and discussed with nursing staff also.  Yevonne Pax, MD [facility] Pulmonary Critical Care Medicine Sleep Medicine

## 2019-05-08 ENCOUNTER — Other Ambulatory Visit (HOSPITAL_COMMUNITY): Payer: BC Managed Care – PPO

## 2019-05-08 DIAGNOSIS — I482 Chronic atrial fibrillation, unspecified: Secondary | ICD-10-CM | POA: Diagnosis not present

## 2019-05-08 DIAGNOSIS — J9621 Acute and chronic respiratory failure with hypoxia: Secondary | ICD-10-CM | POA: Diagnosis not present

## 2019-05-08 DIAGNOSIS — J939 Pneumothorax, unspecified: Secondary | ICD-10-CM | POA: Diagnosis not present

## 2019-05-08 DIAGNOSIS — U071 COVID-19: Secondary | ICD-10-CM | POA: Diagnosis not present

## 2019-05-08 MED ORDER — IOHEXOL 300 MG/ML  SOLN
75.0000 mL | Freq: Once | INTRAMUSCULAR | Status: AC | PRN
  Administered 2019-05-08: 75 mL via INTRAVENOUS

## 2019-05-08 NOTE — Progress Notes (Addendum)
Pulmonary Critical Care Medicine [place]   PULMONARY CRITICAL CARE SERVICE  PROGRESS NOTE  Date of Service: 05/08/2019  Casey Wheeler  JQB:[id number]  DOB: 1960/10/22   DOA: 05/01/2019  Referring Physician: Merton Border, MD  HPI: Casey Wheeler is a 59 y.o. male seen for follow up of Acute on Chronic Respiratory Failure.  Patient remains on full support ventilator FiO2 decreased to 55% satting well.  Medications: Reviewed on Rounds  Physical Exam:  Vitals: Pulse 64 respirations 18 BP 126/77 O2 sat 100% temp 97.3  Ventilator Settings ventilator mode AC VC rate 12 tidal line 500 PEEP of 7 FiO2 55%  . General: Comfortable at this time . Eyes: Grossly normal lids, irises & conjunctiva . ENT: grossly tongue is normal . Neck: no obvious mass . Cardiovascular: S1 S2 normal no gallop . Respiratory: No rales or rhonchi noted . Abdomen: soft . Skin: no rash seen on limited exam . Musculoskeletal: not rigid . Psychiatric:unable to assess . Neurologic: no seizure no involuntary movements         Lab Data:   Basic Metabolic Panel: Recent Labs  Lab 05/02/19 0656 05/07/19 0505  NA 140 139  K 3.6 3.8  CL 100 103  CO2 31 25  GLUCOSE 78 222*  BUN 21* 30*  CREATININE 0.55* 0.62  CALCIUM 9.1 9.2    ABG: Recent Labs  Lab 05/01/19 1845 05/06/19 0834  PHART 7.538* 7.496*  PCO2ART 39.0 39.5  PO2ART 73.2* 74.9*  HCO3 33.1* 30.3*  O2SAT 96.4 94.7    Liver Function Tests: No results for input(s): AST, ALT, ALKPHOS, BILITOT, PROT, ALBUMIN in the last 168 hours. No results for input(s): LIPASE, AMYLASE in the last 168 hours. No results for input(s): AMMONIA in the last 168 hours.  CBC: Recent Labs  Lab 05/02/19 0656 05/07/19 0505  WBC 19.4* 21.8*  HGB 8.8* 10.0*  HCT 28.8* 32.5*  MCV 87.8 86.4  PLT 469* 551*    Cardiac Enzymes: No results for input(s): CKTOTAL, CKMB, CKMBINDEX, TROPONINI in the last 168 hours.  BNP (last 3 results) No results  for input(s): BNP in the last 8760 hours.  ProBNP (last 3 results) No results for input(s): PROBNP in the last 8760 hours.  Radiological Exams: CT ABDOMEN WO CONTRAST  Result Date: 05/07/2019 CLINICAL DATA:  Evaluate anatomy prior to potential percutaneous gastrostomy tube placement. EXAM: CT ABDOMEN WITHOUT CONTRAST TECHNIQUE: Multidetector CT imaging of the abdomen was performed following the standard protocol without IV contrast. COMPARISON:  None. FINDINGS: The lack of intravenous contrast limits the ability to evaluate solid abdominal organs. Lower chest: Limited visualization of the lower thorax demonstrates an age-indeterminate hydropneumothorax involving the medial aspect of the imaged left lung base (representative image 1, series 3). Note, the superior most aspect of the hydropneumothorax is not imaged. Consolidative opacities with associated air bronchograms within the bilateral lung bases, left greater than right. Advanced emphysematous change. Normal heart size. Coronary artery calcifications. Trace amount of pericardial fluid, presumably physiologic. Hepatobiliary: Normal hepatic contour. Note is made of a punctate (approximately 0.9 cm) stone lying dependently within the neck of an otherwise normal-appearing gallbladder. No ascites. Pancreas: Normal noncontrast appearance of the pancreas. Spleen: The spleen is diminutive and dysmorphic in appearance, potentially posttraumatic in etiology. Adrenals/Urinary Tract: Normal noncontrast appearance of the bilateral kidneys. No renal stones. No renal stones are seen along expected course of either ureter. No urinary obstruction or perinephric stranding. Normal noncontrast appearance the bilateral adrenal glands. The urinary bladder was  not imaged. Stomach/Bowel: Enteric tube tip terminates within the gastric antrum. Both hepatic parenchyma as well as the transverse colon are interposed between the anterior wall the stomach and ventral wall of the  abdomen, precluding percutaneous gastrostomy tube placement. Scattered colonic diverticulosis without evidence of superimposed acute diverticulitis. No pneumoperitoneum, pneumatosis or portal venous gas. Vascular/Lymphatic: Atherosclerotic plaque within normal caliber abdominal aorta. No bulky retroperitoneal or mesenteric adenopathy on this noncontrast examination. Other: Note is made of an age-indeterminate retroperitoneal hematoma affecting the left iliacus musculature (image 62, series 3). Musculoskeletal: Post long segment thoracolumbar paraspinal fusion and post intervertebral disc space replacement of L2-L3, L3-L4, L4-L5 and L5-S1. No definite evidence of hardware failure or loosening. IMPRESSION: 1. Gastric anatomy not amenable to percutaneous gastrostomy tube placement due to interposition of the hepatic parenchyma and transverse colon. If gastrostomy tube placement is still required, recommend surgical consultation. 2. Age-indeterminate hydropneumothorax within the imaged medial aspect the left lung base. 3. Bibasilar consolidative opacities with associated air bronchograms, left greater than right, potentially atelectasis though worrisome for multifocal infection/aspiration. 4. Age-indeterminate though potentially chronic left-sided retroperitoneal hematoma affecting the left iliacus musculature. Correlation with hemodynamic stability and serial H&Hs could be performed as indicated. 5. Post long segment thoracolumbar paraspinal fusion and multilevel intervertebral disc space replacement without evidence of hardware failure or loosening 6. Coronary calcifications.  Aortic Atherosclerosis (ICD10-I70.0). 7. Cholelithiasis without evidence of cholecystitis on this noncontrast examination. Critical Value/emergent results were called by telephone at the time of interpretation on 05/07/2019 at 12:48 pm to provider [facility] , who verbally acknowledged these results. Electronically Signed   By: Simonne Come  M.D.   On: 05/07/2019 12:59   CT CHEST W CONTRAST  Result Date: 05/08/2019 CLINICAL DATA:  Pneumothorax EXAM: CT CHEST WITH CONTRAST TECHNIQUE: Multidetector CT imaging of the chest was performed during intravenous contrast administration. CONTRAST:  21mL OMNIPAQUE IOHEXOL 300 MG/ML  SOLN COMPARISON:  CT abdomen 05/07/2019 and chest radiograph from 05/07/2019 FINDINGS: Cardiovascular: Coronary, aortic arch, and branch vessel atherosclerotic vascular disease. Small pericardial effusion. Mediastinum/Nodes: Tracheostomy tube noted. A nasogastric tube enters the stomach. Subcarinal node 1.1 cm in short axis, image 59/3. Right lower paratracheal node 0.9 cm in short axis, image 53/3. Left paratracheal node 1.2 cm in short axis, image 49/3. Left infrahilar lymph node 0.7 cm in short axis, image 61/3. Lungs/Pleura: Posteromedial loculated/contained left hydropneumothorax, measuring 11.3 by 5.9 by 3.4 cm (volume = 120 cm^3). Centrilobular emphysema. Subsegmental atelectasis or scarring in the lingula. Bilateral lower lobe airspace opacities, left greater than right, a component of which is due to atelectasis, although there may be some superimposed consolidative airspace opacity particularly on the left. Trace right pleural effusion. Upper Abdomen: Cholelithiasis. Thick-walled, contracted gallbladder. Elevated left hemidiaphragm. Small rounded spleen versus accessory regenerative splenic tissue. Nonspecific hypodense lesion in the right mid upper kidney laterally on image 156/3. Musculoskeletal: Nonunited left fourth rib fracture medially on the right. Nonunited right sixth and eighth rib fractures posteriorly. Deformities from old left fourth, fifth, sixth, seventh, eighth, ninth, and tenth rib fractures posteriorly on the left side, with nonunion of the sixth and ninth rib fractures. Posterolateral rod and pedicle screw fixation at T4-T5-T6-T7-T8-T9-T11-T12-L1-L2 and extending caudad. No pedicle screws at the T10  level. Chronic mild compression at T10. Chronic appearing inferior endplate compression at T8. Chronic appearing superior endplate compression at L2. IMPRESSION: 1. Posteromedial loculated/contained left hydropneumothorax, 11.3 by 5.9 by 3.4 cm (volume = 120 cubic cm). 2. Bilateral lower lobe airspace opacities, left greater than  right, a component of which is due to atelectasis, although there may be some superimposed consolidative airspace opacity particularly on the left. 3. Coronary, aortic arch, and branch vessel atherosclerotic vascular disease. 4. Small pericardial effusion. 5. Mild mediastinal adenopathy, probably reactive. 6. Cholelithiasis. 7. Trace right pleural effusion. 8. Elevated left hemidiaphragm. 9. Nonspecific hypodense lesion in the right mid upper kidney laterally. 10. Bilateral chronic rib fractures, some nonunited. 11. Posterolateral rod and pedicle screw fixation visualized at T4-T5-T6-T7-T8-T9-T11-T12-L1-L2. 12. Aortic Atherosclerosis (ICD10-I70.0) and Emphysema (ICD10-J43.9). Electronically Signed   By: Gaylyn Rong M.D.   On: 05/08/2019 13:10   DG Chest Port 1 View  Result Date: 05/07/2019 CLINICAL DATA:  Respiratory failure EXAM: PORTABLE CHEST 1 VIEW COMPARISON:  05/05/2018 FINDINGS: Cardiac shadow is stable. Gastric catheter tracheostomy tube are noted in satisfactory position. Elevation of the left hemidiaphragm is again noted. Left basilar atelectasis is seen. The overall degree of aeration on the left has improved significantly in the interval from the prior exam. Old rib fractures are noted on the right. Surgical hardware is noted throughout the thoracolumbar spine. IMPRESSION: Tubes and lines as described above. Left basilar atelectasis with significant improved aeration on the left when compared with the prior exam. Electronically Signed   By: Alcide Clever M.D.   On: 05/07/2019 09:38    Assessment/Plan Active Problems:   Acute on chronic respiratory failure with  hypoxia (HCC)   Chronic atrial fibrillation (HCC)   COVID-19 virus infection   Pneumonia due to COVID-19 virus   Bilateral pneumothorax   Healthcare-associated pneumonia   1. Acute on chronic respiratory failure with hypoxia patient requiring 55% FiO2 satting well at this time continue aggressive pulmonary toilet supportive measures and continue to attempt weaning FiO2 as possible. 2. Chronic atrial fibrillation right now the rate is controlled there is no sense of tachycardia noted. 3. COVID-19 virus infection treated in resolution phase we will continue with supportive care 4. Pneumonia due to COVID-19 this has been treated with residual deficits noted on the last chest film 5. Bilateral pneumothoraces patient has had this previously and is status post chest tube placement again chest x-ray ordered to make sure there is no pneumothorax 6. Healthcare associated pneumonia treated   I have personally seen and evaluated the patient, evaluated laboratory and imaging results, formulated the assessment and plan and placed orders. The Patient requires high complexity decision making with multiple systems involvement.  Rounds were done with the Respiratory Therapy Director and Staff therapists and discussed with nursing staff also.  Yevonne Pax, MD [facility] Pulmonary Critical Care Medicine Sleep Medicine

## 2019-05-09 DIAGNOSIS — I482 Chronic atrial fibrillation, unspecified: Secondary | ICD-10-CM | POA: Diagnosis not present

## 2019-05-09 DIAGNOSIS — J939 Pneumothorax, unspecified: Secondary | ICD-10-CM | POA: Diagnosis not present

## 2019-05-09 DIAGNOSIS — J9621 Acute and chronic respiratory failure with hypoxia: Secondary | ICD-10-CM | POA: Diagnosis not present

## 2019-05-09 DIAGNOSIS — U071 COVID-19: Secondary | ICD-10-CM | POA: Diagnosis not present

## 2019-05-09 LAB — RENAL FUNCTION PANEL
Albumin: 2.8 g/dL — ABNORMAL LOW (ref 3.5–5.0)
Anion gap: 9 (ref 5–15)
BUN: 32 mg/dL — ABNORMAL HIGH (ref 6–20)
CO2: 29 mmol/L (ref 22–32)
Calcium: 9.7 mg/dL (ref 8.9–10.3)
Chloride: 101 mmol/L (ref 98–111)
Creatinine, Ser: 0.47 mg/dL — ABNORMAL LOW (ref 0.61–1.24)
GFR calc Af Amer: >60 mL/min (ref >60)
GFR calc non Af Amer: >60 mL/min (ref >60)
Glucose, Bld: 118 mg/dL — ABNORMAL HIGH (ref 70–99)
Phosphorus: 2.9 mg/dL (ref 2.5–4.6)
Potassium: 4.2 mmol/L (ref 3.5–5.1)
Sodium: 139 mmol/L (ref 135–145)

## 2019-05-09 LAB — CULTURE, RESPIRATORY W GRAM STAIN

## 2019-05-09 LAB — MAGNESIUM: Magnesium: 1.9 mg/dL (ref 1.7–2.4)

## 2019-05-09 NOTE — Progress Notes (Addendum)
Pulmonary Critical Care Medicine Casey Wheeler   PULMONARY CRITICAL CARE SERVICE  PROGRESS NOTE  Date of Service: 05/09/2019  Casey Wheeler  [id number]  DOB: 22-Sep-1960   DOA: 05/01/2019  Referring Physician: Merton Border, MD  HPI: Casey Wheeler is a 59 y.o. male seen for follow up of Acute on Chronic Respiratory Failure.  Patient remains on pressure support 12/5 and FiO2 45%.  Satting well no distress.  Medications: Reviewed on Rounds  Physical Exam:  Vitals: Pulse 75 respirations 18 BP 141/83 O2 sat 100% temp 95.1  Ventilator Settings per support for 5 FiO2 45%  . General: Comfortable at this time . Eyes: Grossly normal lids, irises & conjunctiva . ENT: grossly tongue is normal . Neck: no obvious mass . Cardiovascular: S1 S2 normal no gallop . Respiratory: No rales or rhonchi noted . Abdomen: soft . Skin: no rash seen on limited exam . Musculoskeletal: not rigid . Psychiatric:unable to assess . Neurologic: no seizure no involuntary movements         Lab Data:   Basic Metabolic Panel: Recent Labs  Lab 05/07/19 0505 05/09/19 0805  NA 139 139  K 3.8 4.2  CL 103 101  CO2 25 29  GLUCOSE 222* 118*  BUN 30* 32*  CREATININE 0.62 0.47*  CALCIUM 9.2 9.7  MG  --  1.9  PHOS  --  2.9    ABG: Recent Labs  Lab 05/06/19 0834  PHART 7.496*  PCO2ART 39.5  PO2ART 74.9*  HCO3 30.3*  O2SAT 94.7    Liver Function Tests: Recent Labs  Lab 05/09/19 0805  ALBUMIN 2.8*   No results for input(s): LIPASE, AMYLASE in the last 168 hours. No results for input(s): AMMONIA in the last 168 hours.  CBC: Recent Labs  Lab 05/07/19 0505  WBC 21.8*  HGB 10.0*  HCT 32.5*  MCV 86.4  PLT 551*    Cardiac Enzymes: No results for input(s): CKTOTAL, CKMB, CKMBINDEX, TROPONINI in the last 168 hours.  BNP (last 3 results) No results for input(s): BNP in the last 8760 hours.  ProBNP (last 3 results) No results for input(s): PROBNP in the last 8760  hours.  Radiological Exams: CT CHEST W CONTRAST  Result Date: 05/08/2019 CLINICAL DATA:  Pneumothorax EXAM: CT CHEST WITH CONTRAST TECHNIQUE: Multidetector CT imaging of the chest was performed during intravenous contrast administration. CONTRAST:  3mL OMNIPAQUE IOHEXOL 300 MG/ML  SOLN COMPARISON:  CT abdomen 05/07/2019 and chest radiograph from 05/07/2019 FINDINGS: Cardiovascular: Coronary, aortic arch, and branch vessel atherosclerotic vascular disease. Small pericardial effusion. Mediastinum/Nodes: Tracheostomy tube noted. A nasogastric tube enters the stomach. Subcarinal node 1.1 cm in short axis, image 59/3. Right lower paratracheal node 0.9 cm in short axis, image 53/3. Left paratracheal node 1.2 cm in short axis, image 49/3. Left infrahilar lymph node 0.7 cm in short axis, image 61/3. Lungs/Pleura: Posteromedial loculated/contained left hydropneumothorax, measuring 11.3 by 5.9 by 3.4 cm (volume = 120 cm^3). Centrilobular emphysema. Subsegmental atelectasis or scarring in the lingula. Bilateral lower lobe airspace opacities, left greater than right, a component of which is due to atelectasis, although there may be some superimposed consolidative airspace opacity particularly on the left. Trace right pleural effusion. Upper Abdomen: Cholelithiasis. Thick-walled, contracted gallbladder. Elevated left hemidiaphragm. Small rounded spleen versus accessory regenerative splenic tissue. Nonspecific hypodense lesion in the right mid upper kidney laterally on image 156/3. Musculoskeletal: Nonunited left fourth rib fracture medially on the right. Nonunited right sixth and eighth rib fractures posteriorly. Deformities from old left  fourth, fifth, sixth, seventh, eighth, ninth, and tenth rib fractures posteriorly on the left side, with nonunion of the sixth and ninth rib fractures. Posterolateral rod and pedicle screw fixation at T4-T5-T6-T7-T8-T9-T11-T12-L1-L2 and extending caudad. No pedicle screws at the T10  level. Chronic mild compression at T10. Chronic appearing inferior endplate compression at T8. Chronic appearing superior endplate compression at L2. IMPRESSION: 1. Posteromedial loculated/contained left hydropneumothorax, 11.3 by 5.9 by 3.4 cm (volume = 120 cubic cm). 2. Bilateral lower lobe airspace opacities, left greater than right, a component of which is due to atelectasis, although there may be some superimposed consolidative airspace opacity particularly on the left. 3. Coronary, aortic arch, and branch vessel atherosclerotic vascular disease. 4. Small pericardial effusion. 5. Mild mediastinal adenopathy, probably reactive. 6. Cholelithiasis. 7. Trace right pleural effusion. 8. Elevated left hemidiaphragm. 9. Nonspecific hypodense lesion in the right mid upper kidney laterally. 10. Bilateral chronic rib fractures, some nonunited. 11. Posterolateral rod and pedicle screw fixation visualized at T4-T5-T6-T7-T8-T9-T11-T12-L1-L2. 12. Aortic Atherosclerosis (ICD10-I70.0) and Emphysema (ICD10-J43.9). Electronically Signed   By: Gaylyn Rong M.D.   On: 05/08/2019 13:10    Assessment/Plan Active Problems:   Acute on chronic respiratory failure with hypoxia (HCC)   Chronic atrial fibrillation (HCC)   COVID-19 virus infection   Pneumonia due to COVID-19 virus   Bilateral pneumothorax   Healthcare-associated pneumonia   1. Acute on chronic respiratory failure with hypoxia patient continuing on pressure support at this time as tolerated.  Currently on 45% FiO2 continue supportive measures and pulmonary toilet. 2. Chronic atrial fibrillation right now the rate is controlled there is no sense of tachycardia noted. 3. COVID-19 virus infection treated in resolution phase we will continue with supportive care 4. Pneumonia due to COVID-19 this has been treated with residual deficits noted on the last chest film 5. Bilateral pneumothoraces patient has had this previously and is status post chest tube  placement again chest x-ray ordered to make sure there is no pneumothorax 6. Healthcare associated pneumonia treated   I have personally seen and evaluated the patient, evaluated laboratory and imaging results, formulated the assessment and plan and placed orders. The Patient requires high complexity decision making with multiple systems involvement.  Rounds were done with the Respiratory Therapy Director and Staff therapists and discussed with nursing staff also.  Yevonne Pax, MD [facility] Pulmonary Critical Care Medicine Sleep Medicine

## 2019-05-10 DIAGNOSIS — U071 COVID-19: Secondary | ICD-10-CM | POA: Diagnosis not present

## 2019-05-10 DIAGNOSIS — I482 Chronic atrial fibrillation, unspecified: Secondary | ICD-10-CM | POA: Diagnosis not present

## 2019-05-10 DIAGNOSIS — J939 Pneumothorax, unspecified: Secondary | ICD-10-CM | POA: Diagnosis not present

## 2019-05-10 DIAGNOSIS — J9621 Acute and chronic respiratory failure with hypoxia: Secondary | ICD-10-CM | POA: Diagnosis not present

## 2019-05-10 NOTE — Progress Notes (Signed)
Pulmonary Critical Care Medicine [facility] [place]   PULMONARY CRITICAL CARE SERVICE  PROGRESS NOTE  Date of Service: 05/10/2019  Casey Wheeler  JOA:[id number]  DOB: [birth year]   DOA: 05/01/2019  Referring Physician: Carron Curie, MD  HPI: Casey Wheeler is a 59 y.o. male seen for follow up of Acute on Chronic Respiratory Failure. Patient is off the ventilator on the NAG currently is on 4 L oxygen good saturations are noted  Medications: Reviewed on Rounds  Physical Exam:  Vitals: Temperature 96.8 pulse 58 respiratory rate 14 blood pressures 107/64 saturations 100%  Ventilator Settings off the ventilator on the NAG  . General: Comfortable at this time . Eyes: Grossly normal lids, irises & conjunctiva . ENT: grossly tongue is normal . Neck: no obvious mass . Cardiovascular: S1 S2 normal no gallop . Respiratory: No rhonchi coarse breath sounds . Abdomen: soft . Skin: no rash seen on limited exam . Musculoskeletal: not rigid . Psychiatric:unable to assess . Neurologic: no seizure no involuntary movements         Lab Data:   Basic Metabolic Panel: Recent Labs  Lab 05/07/19 0505 05/09/19 0805  NA 139 139  K 3.8 4.2  CL 103 101  CO2 25 29  GLUCOSE 222* 118*  BUN 30* 32*  CREATININE 0.62 0.47*  CALCIUM 9.2 9.7  MG  --  1.9  PHOS  --  2.9    ABG: Recent Labs  Lab 05/06/19 0834  PHART 7.496*  PCO2ART 39.5  PO2ART 74.9*  HCO3 30.3*  O2SAT 94.7    Liver Function Tests: Recent Labs  Lab 05/09/19 0805  ALBUMIN 2.8*   No results for input(s): LIPASE, AMYLASE in the last 168 hours. No results for input(s): AMMONIA in the last 168 hours.  CBC: Recent Labs  Lab 05/07/19 0505  WBC 21.8*  HGB 10.0*  HCT 32.5*  MCV 86.4  PLT 551*    Cardiac Enzymes: No results for input(s): CKTOTAL, CKMB, CKMBINDEX, TROPONINI in the last 168 hours.  BNP (last 3 results) No results for input(s): BNP in the last 8760 hours.  ProBNP (last 3 results) No  results for input(s): PROBNP in the last 8760 hours.  Radiological Exams: No results found.  Assessment/Plan Active Problems:   Acute on chronic respiratory failure with hypoxia (HCC)   Chronic atrial fibrillation (HCC)   COVID-19 virus infection   Pneumonia due to COVID-19 virus   Bilateral pneumothorax   Healthcare-associated pneumonia   1. Acute on chronic respiratory failure with hypoxia continue with NAG goal is for up to 12 hours. 2. Chronic atrial fibrillation rate controlled 3. COVID-19 virus infection in resolution phase 4. Pneumonia due to COVID-19 treated continue supportive care 5. Bilateral pneumothorax resolved 6. Healthcare associated pneumonia treated improved   I have personally seen and evaluated the patient, evaluated laboratory and imaging results, formulated the assessment and plan and placed orders. The Patient requires high complexity decision making with multiple systems involvement.  Rounds were done with the Respiratory Therapy Director and Staff therapists and discussed with nursing staff also.  Yevonne Pax, MD [facility] Pulmonary Critical Care Medicine Sleep Medicine

## 2019-05-11 DIAGNOSIS — I482 Chronic atrial fibrillation, unspecified: Secondary | ICD-10-CM | POA: Diagnosis not present

## 2019-05-11 DIAGNOSIS — J9621 Acute and chronic respiratory failure with hypoxia: Secondary | ICD-10-CM | POA: Diagnosis not present

## 2019-05-11 DIAGNOSIS — U071 COVID-19: Secondary | ICD-10-CM | POA: Diagnosis not present

## 2019-05-11 DIAGNOSIS — J939 Pneumothorax, unspecified: Secondary | ICD-10-CM | POA: Diagnosis not present

## 2019-05-11 LAB — BASIC METABOLIC PANEL
Anion gap: 12 (ref 5–15)
BUN: 33 mg/dL — ABNORMAL HIGH (ref 6–20)
CO2: 26 mmol/L (ref 22–32)
Calcium: 9 mg/dL (ref 8.9–10.3)
Chloride: 98 mmol/L (ref 98–111)
Creatinine, Ser: 0.42 mg/dL — ABNORMAL LOW (ref 0.61–1.24)
GFR calc Af Amer: >60 mL/min (ref >60)
GFR calc non Af Amer: >60 mL/min (ref >60)
Glucose, Bld: 118 mg/dL — ABNORMAL HIGH (ref 70–99)
Potassium: 4.4 mmol/L (ref 3.5–5.1)
Sodium: 136 mmol/L (ref 135–145)

## 2019-05-11 LAB — CBC
HCT: 30.4 % — ABNORMAL LOW (ref 39.0–52.0)
Hemoglobin: 9.3 g/dL — ABNORMAL LOW (ref 13.0–17.0)
MCH: 27.2 pg (ref 26.0–34.0)
MCHC: 30.6 g/dL (ref 30.0–36.0)
MCV: 88.9 fL (ref 80.0–100.0)
Platelets: 616 10*3/uL — ABNORMAL HIGH (ref 150–400)
RBC: 3.42 MIL/uL — ABNORMAL LOW (ref 4.22–5.81)
RDW: 17.7 % — ABNORMAL HIGH (ref 11.5–15.5)
WBC: 10.9 10*3/uL — ABNORMAL HIGH (ref 4.0–10.5)
nRBC: 0 % (ref 0.0–0.2)

## 2019-05-11 NOTE — Progress Notes (Signed)
Pulmonary Critical Care Medicine [facility] [place]   PULMONARY CRITICAL CARE SERVICE  PROGRESS NOTE  Date of Service: 05/11/2019  Mohanad Carsten  TOI:[id number]  DOB: 1960-10-13   DOA: 05/01/2019  Referring Physician: Carron Curie, MD  HPI: Leroy Pettway is a 59 y.o. male seen for follow up of Acute on Chronic Respiratory Failure.  Patient is off the ventilator right now on the NAG has been on 3 L with a goal of 16 hours  Medications: Reviewed on Rounds  Physical Exam:  Vitals: Temperature 96.0 pulse 71 respiratory rate 23 blood pressure is 91/59 saturations 96%  Ventilator Settings off the ventilator on the NAG right now is on 3 L  . General: Comfortable at this time . Eyes: Grossly normal lids, irises & conjunctiva . ENT: grossly tongue is normal . Neck: no obvious mass . Cardiovascular: S1 S2 normal no gallop . Respiratory: Coarse breath sounds with a few scattered rhonchi . Abdomen: soft . Skin: no rash seen on limited exam . Musculoskeletal: not rigid . Psychiatric:unable to assess . Neurologic: no seizure no involuntary movements         Lab Data:   Basic Metabolic Panel: Recent Labs  Lab 05/07/19 0505 05/09/19 0805 05/11/19 0639  NA 139 139 136  K 3.8 4.2 4.4  CL 103 101 98  CO2 25 29 26   GLUCOSE 222* 118* 118*  BUN 30* 32* 33*  CREATININE 0.62 0.47* 0.42*  CALCIUM 9.2 9.7 9.0  MG  --  1.9  --   PHOS  --  2.9  --     ABG: Recent Labs  Lab 05/06/19 0834  PHART 7.496*  PCO2ART 39.5  PO2ART 74.9*  HCO3 30.3*  O2SAT 94.7    Liver Function Tests: Recent Labs  Lab 05/09/19 0805  ALBUMIN 2.8*   No results for input(s): LIPASE, AMYLASE in the last 168 hours. No results for input(s): AMMONIA in the last 168 hours.  CBC: Recent Labs  Lab 05/07/19 0505 05/11/19 0639  WBC 21.8* 10.9*  HGB 10.0* 9.3*  HCT 32.5* 30.4*  MCV 86.4 88.9  PLT 551* 616*    Cardiac Enzymes: No results for input(s): CKTOTAL, CKMB, CKMBINDEX, TROPONINI in  the last 168 hours.  BNP (last 3 results) No results for input(s): BNP in the last 8760 hours.  ProBNP (last 3 results) No results for input(s): PROBNP in the last 8760 hours.  Radiological Exams: No results found.  Assessment/Plan Active Problems:   Acute on chronic respiratory failure with hypoxia (HCC)   Chronic atrial fibrillation (HCC)   COVID-19 virus infection   Pneumonia due to COVID-19 virus   Bilateral pneumothorax   Healthcare-associated pneumonia   1. Acute on chronic respiratory failure with hypoxia patient is doing well with the wean the plan is going to be to continue to advance as tolerated. 2. Chronic atrial fibrillation rate is controlled 3. COVID-19 pneumonia patient had a follow-up chest x-ray done which shows a improvement in the aeration we will continue to follow 4. COVID-19 virus infection now is in resolution phase 5. Bilateral pneumothoraces status post chest tube placement there is a residual pneumothorax noted we will continue with supportive care 6. Healthcare associated pneumonia treated showing some improvement   I have personally seen and evaluated the patient, evaluated laboratory and imaging results, formulated the assessment and plan and placed orders. The Patient requires high complexity decision making with multiple systems involvement.  Rounds were done with the Respiratory Therapy Director and Staff therapists  and discussed with nursing staff also.  Allyne Gee, MD [facility] Pulmonary Critical Care Medicine Sleep Medicine

## 2019-05-12 DIAGNOSIS — U071 COVID-19: Secondary | ICD-10-CM | POA: Diagnosis not present

## 2019-05-12 DIAGNOSIS — J9621 Acute and chronic respiratory failure with hypoxia: Secondary | ICD-10-CM | POA: Diagnosis not present

## 2019-05-12 DIAGNOSIS — J939 Pneumothorax, unspecified: Secondary | ICD-10-CM | POA: Diagnosis not present

## 2019-05-12 DIAGNOSIS — I482 Chronic atrial fibrillation, unspecified: Secondary | ICD-10-CM | POA: Diagnosis not present

## 2019-05-12 NOTE — Progress Notes (Signed)
Pulmonary Critical Care Medicine [facility] [place]   PULMONARY CRITICAL CARE SERVICE  PROGRESS NOTE  Date of Service: 05/12/2019  Casey Wheeler  OYD:[id number]  DOB: 04/01/1960   DOA: 05/01/2019  Referring Physician: Carron Curie, MD  HPI: Casey Wheeler is a 59 y.o. male seen for follow up of Acute on Chronic Respiratory Failure.  Patient is weaning on the NAG using PMV today the goal is for 20 hours has done well yesterday for 16 hours  Medications: Reviewed on Rounds  Physical Exam:  Vitals: Temperature is 97.4 pulse 60 respiratory 14 blood pressure is 90/56 saturations 98%  Ventilator Settings off the ventilator on the NAG with PMV in place  . General: Comfortable at this time . Eyes: Grossly normal lids, irises & conjunctiva . ENT: grossly tongue is normal . Neck: no obvious mass . Cardiovascular: S1 S2 normal no gallop . Respiratory: Scattered rhonchi expansion is equal at this time . Abdomen: soft . Skin: no rash seen on limited exam . Musculoskeletal: not rigid . Psychiatric:unable to assess . Neurologic: no seizure no involuntary movements         Lab Data:   Basic Metabolic Panel: Recent Labs  Lab 05/07/19 0505 05/09/19 0805 05/11/19 0639  NA 139 139 136  K 3.8 4.2 4.4  CL 103 101 98  CO2 25 29 26   GLUCOSE 222* 118* 118*  BUN 30* 32* 33*  CREATININE 0.62 0.47* 0.42*  CALCIUM 9.2 9.7 9.0  MG  --  1.9  --   PHOS  --  2.9  --     ABG: Recent Labs  Lab 05/06/19 0834  PHART 7.496*  PCO2ART 39.5  PO2ART 74.9*  HCO3 30.3*  O2SAT 94.7    Liver Function Tests: Recent Labs  Lab 05/09/19 0805  ALBUMIN 2.8*   No results for input(s): LIPASE, AMYLASE in the last 168 hours. No results for input(s): AMMONIA in the last 168 hours.  CBC: Recent Labs  Lab 05/07/19 0505 05/11/19 0639  WBC 21.8* 10.9*  HGB 10.0* 9.3*  HCT 32.5* 30.4*  MCV 86.4 88.9  PLT 551* 616*    Cardiac Enzymes: No results for input(s): CKTOTAL, CKMB, CKMBINDEX,  TROPONINI in the last 168 hours.  BNP (last 3 results) No results for input(s): BNP in the last 8760 hours.  ProBNP (last 3 results) No results for input(s): PROBNP in the last 8760 hours.  Radiological Exams: No results found.  Assessment/Plan Active Problems:   Acute on chronic respiratory failure with hypoxia (HCC)   Chronic atrial fibrillation (HCC)   COVID-19 virus infection   Pneumonia due to COVID-19 virus   Bilateral pneumothorax   Healthcare-associated pneumonia   1. Acute on chronic respiratory failure with hypoxia patient currently is on the NAG with PMV on 20-hour goal as already indicated did well yesterday plan is to advance as tolerated 2. COVID-19 virus infection in recovery phase 3. Chronic atrial fibrillation rate is controlled 4. Pneumonia due to COVID-19 treated clinically is improving 5. Bilateral pneumothoraces status post chest tube drainage 6. Healthcare associated pneumonia treated   I have personally seen and evaluated the patient, evaluated laboratory and imaging results, formulated the assessment and plan and placed orders. The Patient requires high complexity decision making with multiple systems involvement.  Rounds were done with the Respiratory Therapy Director and Staff therapists and discussed with nursing staff also.  05/13/19, MD [facility] Pulmonary Critical Care Medicine Sleep Medicine

## 2019-05-13 DIAGNOSIS — J9621 Acute and chronic respiratory failure with hypoxia: Secondary | ICD-10-CM | POA: Diagnosis not present

## 2019-05-13 DIAGNOSIS — I482 Chronic atrial fibrillation, unspecified: Secondary | ICD-10-CM | POA: Diagnosis not present

## 2019-05-13 DIAGNOSIS — J939 Pneumothorax, unspecified: Secondary | ICD-10-CM | POA: Diagnosis not present

## 2019-05-13 DIAGNOSIS — U071 COVID-19: Secondary | ICD-10-CM | POA: Diagnosis not present

## 2019-05-13 NOTE — Progress Notes (Signed)
Pulmonary Critical Care Medicine [facility] [place]   PULMONARY CRITICAL CARE SERVICE  PROGRESS NOTE  Date of Service: 05/13/2019  Casey Wheeler  AOZ:[id number]  DOB: 02-02-1961   DOA: 05/01/2019  Referring Physician: Carron Curie, MD  HPI: Casey Wheeler is a 59 y.o. male seen for follow up of Acute on Chronic Respiratory Failure.  Patient currently is doing very well on the NAG has been on 3 L oxygen and will be advanced as tolerated  Medications: Reviewed on Rounds  Physical Exam:  Vitals: Temperature is 97.4 pulse 74 respiratory 18 blood pressure is 129/78 saturations 100%  Ventilator Settings on the NAG on 3 L of oxygen  . General: Comfortable at this time . Eyes: Grossly normal lids, irises & conjunctiva . ENT: grossly tongue is normal . Neck: no obvious mass . Cardiovascular: S1 S2 normal no gallop . Respiratory: Coarse breath sounds with a few rhonchi . Abdomen: soft . Skin: no rash seen on limited exam . Musculoskeletal: not rigid . Psychiatric:unable to assess . Neurologic: no seizure no involuntary movements         Lab Data:   Basic Metabolic Panel: Recent Labs  Lab 05/07/19 0505 05/09/19 0805 05/11/19 0639  NA 139 139 136  K 3.8 4.2 4.4  CL 103 101 98  CO2 25 29 26   GLUCOSE 222* 118* 118*  BUN 30* 32* 33*  CREATININE 0.62 0.47* 0.42*  CALCIUM 9.2 9.7 9.0  MG  --  1.9  --   PHOS  --  2.9  --     ABG: No results for input(s): PHART, PCO2ART, PO2ART, HCO3, O2SAT in the last 168 hours.  Liver Function Tests: Recent Labs  Lab 05/09/19 0805  ALBUMIN 2.8*   No results for input(s): LIPASE, AMYLASE in the last 168 hours. No results for input(s): AMMONIA in the last 168 hours.  CBC: Recent Labs  Lab 05/07/19 0505 05/11/19 0639  WBC 21.8* 10.9*  HGB 10.0* 9.3*  HCT 32.5* 30.4*  MCV 86.4 88.9  PLT 551* 616*    Cardiac Enzymes: No results for input(s): CKTOTAL, CKMB, CKMBINDEX, TROPONINI in the last 168 hours.  BNP (last 3  results) No results for input(s): BNP in the last 8760 hours.  ProBNP (last 3 results) No results for input(s): PROBNP in the last 8760 hours.  Radiological Exams: No results found.  Assessment/Plan Active Problems:   Acute on chronic respiratory failure with hypoxia (HCC)   Chronic atrial fibrillation (HCC)   COVID-19 virus infection   Pneumonia due to COVID-19 virus   Bilateral pneumothorax   Healthcare-associated pneumonia   1. Acute on chronic respiratory failure with hypoxia continue to wean on the protocol as tolerated patient should be going 24 hours on the NAG then will begin capping 2. Chronic atrial fibrillation rate is controlled 3. COVID-19 virus infection resolved 4. Pneumonia due to COVID-19 clinically is improving 5. Bilateral pneumothoraces status post chest tube drainage 6. Healthcare associated pneumonia treated clinically improved   I have personally seen and evaluated the patient, evaluated laboratory and imaging results, formulated the assessment and plan and placed orders. The Patient requires high complexity decision making with multiple systems involvement.  Rounds were done with the Respiratory Therapy Director and Staff therapists and discussed with nursing staff also.  05/13/19, MD [facility] Pulmonary Critical Care Medicine Sleep Medicine

## 2019-05-13 NOTE — Progress Notes (Signed)
PROGRESS NOTE    Casey Wheeler  ZOX:[id number] DOB: 06/18/1960 DOA: 05/01/2019  Brief Narrative:  Casey Wheeler is an 59 y.o. male who was initially admitted to [facility] on 01/01/2019 with worsening shortness of breath and cough that started 3 days prior to admission.  He has multiple medical problems including history of COPD on oxygen, scoliosis, chronic back pain, GERD, alcohol abuse, atrial fibrillation, gout, chronic anemia, hypertension, Wernicke's encephalopathy, paroxysmal atrial fibrillation, hyperlipidemia, GERD, depression.  In the ER he was found to have oxygen saturation of 77% on room air.  He was found to have COVID-19 infection.  During his hospital stay his oxygenation worsened and he was intubated on 03/08/2019.  He had a prolonged course on the vent but was eventually extubated on 03/21/2019 but reintubated on 03/22/2019.  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  Patient hospital course complicated by pneumomediastinum and bilateral pneumothorax in the first week of January requiring bilateral chest tubes.  He had tracheal aspirate that was growing Pseudomonas.  He was initially placed on meropenem and then changed to Maxipime and aminoglycoside.  He was then switched to clindamycin, aztreonam per infectious disease on 04/30/2019.  Due to his complex medical problems he was transferred here for further care.  Here he had leukocytosis, fever.  Chest x-ray per report significantly worsened volume loss in the left hemithorax with near complete opacification of the left hemithorax.  Likely central left airway mucoid impaction with near complete left lung atelectasis/pneumonia.  Hazy right lung opacities.  He has a trach in place. The last time I saw him he was on 100% FiO2, PEEP of 7.  Recommended to treat with IV cefepime and Flagyl.  His respiratory cultures from here showed Pseudomonas, light growth MRSA.  He is completing treatment with his antibiotics.  At this time his respiratory status  has improved.  He is currently on 4 L oxygen nasal cannula.  Denies having any shortness of breath at this time.   Assessment & Plan:   Active Problems:   Acute on chronic respiratory failure with hypoxia (HCC)   Chronic atrial fibrillation (HCC)   COVID-19 virus infection   Pneumonia due to COVID-19 virus   Bilateral pneumothorax   Healthcare-associated pneumonia Ventilator dependent respiratory failure COPD Dysphagia Protein calorie malnutrition History of alcohol abuse Wernicke's encephalopathy Depression/anxiety  Acute on chronic respiratory failure with hypoxia:  Likely multifactorial.  He has COPD and recently had COVID-19 pneumonia and healthcare associated pneumonia.  His respiratory cultures at the outside facility showed Pseudomonas. When I last saw him he was on 100% FiO2, PEEP of 7.  His respiratory cultures from here showed light growth Pseudomonas aeruginosa, MRSA also light growth.  He is completing treatment with cefepime, Flagyl for anaerobic coverage due to concern for ongoing aspiration.  At this time he has improved and is currently on 4 L oxygen nasal cannula. Pulmonary following. Continue pulmonary toileting.  Since his respiratory status is stable at this time and he is afebrile without any shortness of breath we could monitor clinically.  If his respiratory status worsens would recommend to repeat chest imaging and start him on treatment with ciprofloxacin and Flagyl.  COVID-19 infection with pneumonia: He received convalescent plasma at the outside facility.  Remdesivir was not given secondary to elevated LFTs.  Continue supportive management per primary team.  Healthcare associated pneumonia: Respiratory cultures reportedly showed Pseudomonas at the outside facility.    He was on clindamycin/Azactam.  Discontinued both of these because  of his worsening respiratory failure and switched to cefepime and Flagyl.    He is completing treatment with antibiotics and  appears to be stable.  Although the respiratory cultures from here showed Pseudomonas aeruginosa, MRSA both of which were light growth. Since his respite status is improving and he is afebrile we could monitor for now.  Continue close monitoring.  If his oxygen requirement is going up or if his respiratory status is worsening would recommend to repeat chest imaging and start him on ciprofloxacin and Flagyl. Pulmonary following.  Leukocytosis: Likely secondary to pneumonia.  He is also on steroids which could be driving the WBC count.  At this time he is afebrile and WBC count also improving. Plan as mentioned above.  Continue to monitor counts.  Bilateral pneumothorax: He is status post chest tubes.  Currently chest tubes removed.  Appears to be stable at this time.  Paroxysmal atrial fibrillation: On amiodarone and diltiazem.  Continue medications per primary team.  Protein calorie malnutrition/dysphagia: On tube feeds per NG tube.  He is not a candidate for PEG tube. Unfortunately due to his dysphagia he is very high risk for aspiration and worsening respiratory failure secondary to aspiration pneumonia.  History of alcoholism/Wernicke's: Continue medications per primary team.  Depression/anxiety: Continue medication and management per primary team.  Unfortunately due to his complex medical problems he is very high risk for worsening and decompensation. Plan of care discussed with the primary team.  Subjective: He is complaining of back pain and asking for pain medications.  He denies having any nausea, vomiting.  He says he had 1 loose stool this morning.  Denies having any further diarrhea or abdominal pain at this time.  Objective: Vitals: Temperature 98.1, pulse 98, respiratory rate 22, blood pressure 117/82, pulse oximetry 94%  Examination:  General: Thin, ill-appearing male, awake, on O2 nasal canula Eyes: PERLA, EOMI, has NG tube in place ENMT: external ears and nose appear  normal, Lips appears normal Neck: Has trach in place CVS: S1-S2 clear, no murmur Respiratory: Decreased breath sound lower lobes, rhonchi, occasional wheezing Abdomen: soft nontender, nondistended, normal bowel sounds  Musculoskeletal: no edema, no contractures Neuro: He is able to move all extremities, following commands, appears to have debility with generalized weakness Psych: stable mood and affect, mental status Skin: no rashes    Data Reviewed: I have personally reviewed following labs and imaging studies  CBC: Recent Labs  Lab 05/07/19 0505 05/11/19 0639  WBC 21.8* 10.9*  HGB 10.0* 9.3*  HCT 32.5* 30.4*  MCV 86.4 88.9  PLT 551* 299*   Basic Metabolic Panel: Recent Labs  Lab 05/07/19 0505 05/09/19 0805 05/11/19 0639  NA 139 139 136  K 3.8 4.2 4.4  CL 103 101 98  CO2 25 29 26   GLUCOSE 222* 118* 118*  BUN 30* 32* 33*  CREATININE 0.62 0.47* 0.42*  CALCIUM 9.2 9.7 9.0  MG  --  1.9  --   PHOS  --  2.9  --    GFR: CrCl cannot be calculated (Unknown ideal weight.). Liver Function Tests: Recent Labs  Lab 05/09/19 0805  ALBUMIN 2.8*   No results for input(s): LIPASE, AMYLASE in the last 168 hours. No results for input(s): AMMONIA in the last 168 hours. Coagulation Profile: No results for input(s): INR, PROTIME in the last 168 hours. Cardiac Enzymes: No results for input(s): CKTOTAL, CKMB, CKMBINDEX, TROPONINI in the last 168 hours. BNP (last 3 results) No results for input(s): PROBNP in the last  8760 hours. HbA1C: No results for input(s): HGBA1C in the last 72 hours. CBG: No results for input(s): GLUCAP in the last 168 hours. Lipid Profile: No results for input(s): CHOL, HDL, LDLCALC, TRIG, CHOLHDL, LDLDIRECT in the last 72 hours. Thyroid Function Tests: No results for input(s): TSH, T4TOTAL, FREET4, T3FREE, THYROIDAB in the last 72 hours. Anemia Panel: No results for input(s): VITAMINB12, FOLATE, FERRITIN, TIBC, IRON, RETICCTPCT in the last 72  hours. Sepsis Labs: No results for input(s): PROCALCITON, LATICACIDVEN in the last 168 hours.  Recent Results (from the past 240 hour(s))  Culture, respiratory (non-expectorated)     Status: None   Collection Time: 05/06/19  4:28 PM   Specimen: Tracheal Aspirate; Respiratory  Result Value Ref Range Status   Specimen Description TRACHEAL ASPIRATE  Final   Special Requests NONE  Final   Gram Stain   Final    ABUNDANT WBC PRESENT, PREDOMINANTLY PMN MODERATE GRAM POSITIVE COCCI RARE GRAM NEGATIVE RODS Performed at [facility], [address], [place], [place] [address]    Culture   Final    FEW PSEUDOMONAS AERUGINOSA FEW METHICILLIN RESISTANT STAPHYLOCOCCUS AUREUS    Report Status 05/09/2019 FINAL  Final   Organism ID, Bacteria PSEUDOMONAS AERUGINOSA  Final   Organism ID, Bacteria METHICILLIN RESISTANT STAPHYLOCOCCUS AUREUS  Final      Susceptibility   Methicillin resistant staphylococcus aureus - MIC*    CIPROFLOXACIN <=0.5 SENSITIVE Sensitive     ERYTHROMYCIN >=8 RESISTANT Resistant     GENTAMICIN <=0.5 SENSITIVE Sensitive     OXACILLIN >=4 RESISTANT Resistant     TETRACYCLINE <=1 SENSITIVE Sensitive     VANCOMYCIN 1 SENSITIVE Sensitive     TRIMETH/SULFA <=10 SENSITIVE Sensitive     CLINDAMYCIN >=8 RESISTANT Resistant     RIFAMPIN <=0.5 SENSITIVE Sensitive     Inducible Clindamycin NEGATIVE Sensitive     * FEW METHICILLIN RESISTANT STAPHYLOCOCCUS AUREUS   Pseudomonas aeruginosa - MIC*    CEFTAZIDIME 4 SENSITIVE Sensitive     CIPROFLOXACIN 0.5 SENSITIVE Sensitive     GENTAMICIN <=1 SENSITIVE Sensitive     IMIPENEM 2 SENSITIVE Sensitive     * FEW PSEUDOMONAS AERUGINOSA         Radiology Studies: No results found.      Scheduled Meds: Please see MAR    Vonzella Nipple, MD  05/13/2019, 3:54 PM

## 2019-05-14 DIAGNOSIS — J939 Pneumothorax, unspecified: Secondary | ICD-10-CM | POA: Diagnosis not present

## 2019-05-14 DIAGNOSIS — I482 Chronic atrial fibrillation, unspecified: Secondary | ICD-10-CM | POA: Diagnosis not present

## 2019-05-14 DIAGNOSIS — U071 COVID-19: Secondary | ICD-10-CM | POA: Diagnosis not present

## 2019-05-14 DIAGNOSIS — J9621 Acute and chronic respiratory failure with hypoxia: Secondary | ICD-10-CM | POA: Diagnosis not present

## 2019-05-14 NOTE — Progress Notes (Signed)
Pulmonary Critical Care Medicine [facility] [place]   PULMONARY CRITICAL CARE SERVICE  PROGRESS NOTE  Date of Service: 05/14/2019  Casey Wheeler  [id number]  DOB: 10-May-1960   DOA: 05/01/2019  Referring Physician: Carron Curie, MD  HPI: Casey Wheeler is a 59 y.o. male seen for follow up of Acute on Chronic Respiratory Failure.  Patient is on the ventilator he is doing relatively well has been on 2 L of oxygen today will be 72 hours  Medications: Reviewed on Rounds  Physical Exam:  Vitals: Temperature is 97.2 pulse 75 respiratory 18 blood pressure is 126/79 saturations 99%  Ventilator Settings off the ventilator on the NAG  . General: Comfortable at this time . Eyes: Grossly normal lids, irises & conjunctiva . ENT: grossly tongue is normal . Neck: no obvious mass . Cardiovascular: S1 S2 normal no gallop . Respiratory: No rhonchi no rales are noted at this time . Abdomen: soft . Skin: no rash seen on limited exam . Musculoskeletal: not rigid . Psychiatric:unable to assess . Neurologic: no seizure no involuntary movements         Lab Data:   Basic Metabolic Panel: Recent Labs  Lab 05/09/19 0805 05/11/19 0639  NA 139 136  K 4.2 4.4  CL 101 98  CO2 29 26  GLUCOSE 118* 118*  BUN 32* 33*  CREATININE 0.47* 0.42*  CALCIUM 9.7 9.0  MG 1.9  --   PHOS 2.9  --     ABG: No results for input(s): PHART, PCO2ART, PO2ART, HCO3, O2SAT in the last 168 hours.  Liver Function Tests: Recent Labs  Lab 05/09/19 0805  ALBUMIN 2.8*   No results for input(s): LIPASE, AMYLASE in the last 168 hours. No results for input(s): AMMONIA in the last 168 hours.  CBC: Recent Labs  Lab 05/11/19 0639  WBC 10.9*  HGB 9.3*  HCT 30.4*  MCV 88.9  PLT 616*    Cardiac Enzymes: No results for input(s): CKTOTAL, CKMB, CKMBINDEX, TROPONINI in the last 168 hours.  BNP (last 3 results) No results for input(s): BNP in the last 8760 hours.  ProBNP (last 3 results) No results  for input(s): PROBNP in the last 8760 hours.  Radiological Exams: No results found.  Assessment/Plan Active Problems:   Acute on chronic respiratory failure with hypoxia (HCC)   Chronic atrial fibrillation (HCC)   COVID-19 virus infection   Pneumonia due to COVID-19 virus   Bilateral pneumothorax   Healthcare-associated pneumonia   1. Acute on chronic respiratory failure hypoxia plan is to continue with the wean over the weekend hopefully we should be able to start with capping trials. 2. Chronic atrial fibrillation rate controlled we will continue supportive care 3. COVID-19 virus infection in resolution phase 4. Pneumonia due to COVID-19 clinically improving 5. Bilateral pneumothoraces status post chest tube bilateral 6. Healthcare associated pneumonia clinically improved   I have personally seen and evaluated the patient, evaluated laboratory and imaging results, formulated the assessment and plan and placed orders. The Patient requires high complexity decision making with multiple systems involvement.  Rounds were done with the Respiratory Therapy Director and Staff therapists and discussed with nursing staff also.  Yevonne Pax, MD [facility] Pulmonary Critical Care Medicine Sleep Medicine

## 2019-05-15 DIAGNOSIS — U071 COVID-19: Secondary | ICD-10-CM | POA: Diagnosis not present

## 2019-05-15 DIAGNOSIS — J939 Pneumothorax, unspecified: Secondary | ICD-10-CM | POA: Diagnosis not present

## 2019-05-15 DIAGNOSIS — I482 Chronic atrial fibrillation, unspecified: Secondary | ICD-10-CM | POA: Diagnosis not present

## 2019-05-15 DIAGNOSIS — J9621 Acute and chronic respiratory failure with hypoxia: Secondary | ICD-10-CM | POA: Diagnosis not present

## 2019-05-15 NOTE — Progress Notes (Addendum)
Pulmonary Critical Care Medicine [facility] [place]   PULMONARY CRITICAL CARE SERVICE  PROGRESS NOTE  Date of Service: 05/15/2019  Casey Wheeler  YSA:[id number]  DOB: 1960-09-17   DOA: 05/01/2019  Referring Physician: Carron Curie, MD  HPI: Casey Wheeler is a 59 y.o. male seen for follow up of Acute on Chronic Respiratory Failure.  Patient remains on NAG 4 l/min satting well no fever distress.  Medications: Reviewed on Rounds  Physical Exam:  Vitals: Pulse 103 respirations 25 BP 104/76 O2 sat 95% temp 96.2  Ventilator Settings NAG 4 llp  . General: Comfortable at this time . Eyes: Grossly normal lids, irises & conjunctiva . ENT: grossly tongue is normal . Neck: no obvious mass . Cardiovascular: S1 S2 normal no gallop . Respiratory: No rales or rhonchi noted . Abdomen: soft . Skin: no rash seen on limited exam . Musculoskeletal: not rigid . Psychiatric:unable to assess . Neurologic: no seizure no involuntary movements         Lab Data:   Basic Metabolic Panel: Recent Labs  Lab 05/09/19 0805 05/11/19 0639  NA 139 136  K 4.2 4.4  CL 101 98  CO2 29 26  GLUCOSE 118* 118*  BUN 32* 33*  CREATININE 0.47* 0.42*  CALCIUM 9.7 9.0  MG 1.9  --   PHOS 2.9  --     ABG: No results for input(s): PHART, PCO2ART, PO2ART, HCO3, O2SAT in the last 168 hours.  Liver Function Tests: Recent Labs  Lab 05/09/19 0805  ALBUMIN 2.8*   No results for input(s): LIPASE, AMYLASE in the last 168 hours. No results for input(s): AMMONIA in the last 168 hours.  CBC: Recent Labs  Lab 05/11/19 0639  WBC 10.9*  HGB 9.3*  HCT 30.4*  MCV 88.9  PLT 616*    Cardiac Enzymes: No results for input(s): CKTOTAL, CKMB, CKMBINDEX, TROPONINI in the last 168 hours.  BNP (last 3 results) No results for input(s): BNP in the last 8760 hours.  ProBNP (last 3 results) No results for input(s): PROBNP in the last 8760 hours.  Radiological Exams: No results  found.  Assessment/Plan Active Problems:   Acute on chronic respiratory failure with hypoxia (HCC)   Chronic atrial fibrillation (HCC)   COVID-19 virus infection   Pneumonia due to COVID-19 virus   Bilateral pneumothorax   Healthcare-associated pneumonia   1. Acute on chronic respiratory failure hypoxia patient remains on NAG plans to begin capping trials.  Continue supportive measures and pulmonary toilet. 2. Chronic atrial fibrillation rate controlled we will continue supportive care 3. COVID-19 virus infection in resolution phase 4. Pneumonia due to COVID-19 clinically improving 5. Bilateral pneumothoraces status post chest tube bilateral 6. Healthcare associated pneumonia clinically improved   I have personally seen and evaluated the patient, evaluated laboratory and imaging results, formulated the assessment and plan and placed orders. The Patient requires high complexity decision making with multiple systems involvement.  Rounds were done with the Respiratory Therapy Director and Staff therapists and discussed with nursing staff also.  Yevonne Pax, MD [facility] Pulmonary Critical Care Medicine Sleep Medicine

## 2019-05-16 DIAGNOSIS — U071 COVID-19: Secondary | ICD-10-CM | POA: Diagnosis not present

## 2019-05-16 DIAGNOSIS — I482 Chronic atrial fibrillation, unspecified: Secondary | ICD-10-CM | POA: Diagnosis not present

## 2019-05-16 DIAGNOSIS — J9621 Acute and chronic respiratory failure with hypoxia: Secondary | ICD-10-CM | POA: Diagnosis not present

## 2019-05-16 DIAGNOSIS — J939 Pneumothorax, unspecified: Secondary | ICD-10-CM | POA: Diagnosis not present

## 2019-05-16 NOTE — Progress Notes (Addendum)
Pulmonary Critical Care Medicine [facility] [place]   PULMONARY CRITICAL CARE SERVICE  PROGRESS NOTE  Date of Service: 05/16/2019  Casey Wheeler  YHC:[id number]  DOB: 05/09/1960   DOA: 05/01/2019  Referring Physician: Carron Curie, MD  HPI: Casey Wheeler is a 59 y.o. male seen for follow up of Acute on Chronic Respiratory Failure.  Patient continues on 4 L NAG satting well at this time.  Medications: Reviewed on Rounds  Physical Exam:  Vitals: Pulse 90 respirations 22 BP 99/63 O2 sat 93% temp 97.9  Ventilator Settings NAG 4 L  . General: Comfortable at this time . Eyes: Grossly normal lids, irises & conjunctiva . ENT: grossly tongue is normal . Neck: no obvious mass . Cardiovascular: S1 S2 normal no gallop . Respiratory: No rales or rhonchi noted . Abdomen: soft . Skin: no rash seen on limited exam . Musculoskeletal: not rigid . Psychiatric:unable to assess . Neurologic: no seizure no involuntary movements         Lab Data:   Basic Metabolic Panel: Recent Labs  Lab 05/11/19 0639  NA 136  K 4.4  CL 98  CO2 26  GLUCOSE 118*  BUN 33*  CREATININE 0.42*  CALCIUM 9.0    ABG: No results for input(s): PHART, PCO2ART, PO2ART, HCO3, O2SAT in the last 168 hours.  Liver Function Tests: No results for input(s): AST, ALT, ALKPHOS, BILITOT, PROT, ALBUMIN in the last 168 hours. No results for input(s): LIPASE, AMYLASE in the last 168 hours. No results for input(s): AMMONIA in the last 168 hours.  CBC: Recent Labs  Lab 05/11/19 0639  WBC 10.9*  HGB 9.3*  HCT 30.4*  MCV 88.9  PLT 616*    Cardiac Enzymes: No results for input(s): CKTOTAL, CKMB, CKMBINDEX, TROPONINI in the last 168 hours.  BNP (last 3 results) No results for input(s): BNP in the last 8760 hours.  ProBNP (last 3 results) No results for input(s): PROBNP in the last 8760 hours.  Radiological Exams: No results found.  Assessment/Plan Active Problems:   Acute on chronic respiratory  failure with hypoxia (HCC)   Chronic atrial fibrillation (HCC)   COVID-19 virus infection   Pneumonia due to COVID-19 virus   Bilateral pneumothorax   Healthcare-associated pneumonia   1. Acute on chronic respiratory failure hypoxia  patient's current trach will be downsized to a #6 XLT cuffless today and capping trials will begin.  Continue supportive measures and pulmonary toilet. 2. Chronic atrial fibrillation rate controlled we will continue supportive care 3. COVID-19 virus infection in resolution phase 4. Pneumonia due to COVID-19 clinically improving 5. Bilateral pneumothoraces status post chest tube bilateral 6. Healthcare associated pneumonia clinically improved   I have personally seen and evaluated the patient, evaluated laboratory and imaging results, formulated the assessment and plan and placed orders. The Patient requires high complexity decision making with multiple systems involvement.  Rounds were done with the Respiratory Therapy Director and Staff therapists and discussed with nursing staff also.  Yevonne Pax, MD [facility] Pulmonary Critical Care Medicine Sleep Medicine

## 2019-05-17 ENCOUNTER — Other Ambulatory Visit: Payer: Self-pay

## 2019-05-17 ENCOUNTER — Emergency Department (HOSPITAL_COMMUNITY)
Admission: EM | Admit: 2019-05-17 | Discharge: 2019-05-17 | Disposition: A | Discharge status: Home / Self Care | Payer: BC Managed Care – PPO | Attending: Emergency Medicine | Admitting: Emergency Medicine

## 2019-05-17 ENCOUNTER — Other Ambulatory Visit (HOSPITAL_COMMUNITY): Payer: BC Managed Care – PPO

## 2019-05-17 ENCOUNTER — Encounter (HOSPITAL_COMMUNITY): Payer: Self-pay | Admitting: Emergency Medicine

## 2019-05-17 ENCOUNTER — Inpatient Hospital Stay
Admission: AD | Admit: 2019-05-17 | Discharge: 2019-06-07 | Disposition: A | Discharge status: Skilled Nursing Facility | Payer: BC Managed Care – PPO | Source: Other Acute Inpatient Hospital | Attending: Internal Medicine | Admitting: Internal Medicine

## 2019-05-17 DIAGNOSIS — J1282 Pneumonia due to coronavirus disease 2019: Secondary | ICD-10-CM | POA: Diagnosis present

## 2019-05-17 DIAGNOSIS — Z9889 Other specified postprocedural states: Secondary | ICD-10-CM

## 2019-05-17 DIAGNOSIS — Z4682 Encounter for fitting and adjustment of non-vascular catheter: Secondary | ICD-10-CM

## 2019-05-17 DIAGNOSIS — J90 Pleural effusion, not elsewhere classified: Secondary | ICD-10-CM

## 2019-05-17 DIAGNOSIS — T17908A Unspecified foreign body in respiratory tract, part unspecified causing other injury, initial encounter: Secondary | ICD-10-CM

## 2019-05-17 DIAGNOSIS — J939 Pneumothorax, unspecified: Secondary | ICD-10-CM | POA: Diagnosis present

## 2019-05-17 DIAGNOSIS — J948 Other specified pleural conditions: Secondary | ICD-10-CM

## 2019-05-17 DIAGNOSIS — I480 Paroxysmal atrial fibrillation: Secondary | ICD-10-CM | POA: Insufficient documentation

## 2019-05-17 DIAGNOSIS — R778 Other specified abnormalities of plasma proteins: Secondary | ICD-10-CM | POA: Insufficient documentation

## 2019-05-17 DIAGNOSIS — U071 COVID-19: Secondary | ICD-10-CM | POA: Diagnosis present

## 2019-05-17 DIAGNOSIS — J9621 Acute and chronic respiratory failure with hypoxia: Secondary | ICD-10-CM | POA: Diagnosis present

## 2019-05-17 DIAGNOSIS — J189 Pneumonia, unspecified organism: Secondary | ICD-10-CM | POA: Diagnosis present

## 2019-05-17 DIAGNOSIS — J869 Pyothorax without fistula: Secondary | ICD-10-CM

## 2019-05-17 DIAGNOSIS — Z9689 Presence of other specified functional implants: Secondary | ICD-10-CM

## 2019-05-17 DIAGNOSIS — I482 Chronic atrial fibrillation, unspecified: Secondary | ICD-10-CM | POA: Diagnosis present

## 2019-05-17 DIAGNOSIS — J9690 Respiratory failure, unspecified, unspecified whether with hypoxia or hypercapnia: Secondary | ICD-10-CM

## 2019-05-17 DIAGNOSIS — R079 Chest pain, unspecified: Secondary | ICD-10-CM | POA: Diagnosis present

## 2019-05-17 LAB — CBC WITH DIFFERENTIAL/PLATELET
Abs Immature Granulocytes: 0 10*3/uL (ref 0.00–0.07)
Basophils Absolute: 0 10*3/uL (ref 0.0–0.1)
Basophils Relative: 0 %
Eosinophils Absolute: 0 10*3/uL (ref 0.0–0.5)
Eosinophils Relative: 0 %
HCT: 39.9 % (ref 39.0–52.0)
Hemoglobin: 12 g/dL — ABNORMAL LOW (ref 13.0–17.0)
Lymphocytes Relative: 4 %
Lymphs Abs: 1.4 10*3/uL (ref 0.7–4.0)
MCH: 26.8 pg (ref 26.0–34.0)
MCHC: 30.1 g/dL (ref 30.0–36.0)
MCV: 89.1 fL (ref 80.0–100.0)
Monocytes Absolute: 0 10*3/uL — ABNORMAL LOW (ref 0.1–1.0)
Monocytes Relative: 0 %
Neutro Abs: 34.5 10*3/uL — ABNORMAL HIGH (ref 1.7–7.7)
Neutrophils Relative %: 96 %
Platelets: 571 10*3/uL — ABNORMAL HIGH (ref 150–400)
RBC: 4.48 MIL/uL (ref 4.22–5.81)
RDW: 19 % — ABNORMAL HIGH (ref 11.5–15.5)
WBC: 35.9 10*3/uL — ABNORMAL HIGH (ref 4.0–10.5)
nRBC: 0 % (ref 0.0–0.2)
nRBC: 0 /100 WBC

## 2019-05-17 LAB — BASIC METABOLIC PANEL
Anion gap: 14 (ref 5–15)
BUN: 39 mg/dL — ABNORMAL HIGH (ref 6–20)
CO2: 27 mmol/L (ref 22–32)
Calcium: 8.8 mg/dL — ABNORMAL LOW (ref 8.9–10.3)
Chloride: 98 mmol/L (ref 98–111)
Creatinine, Ser: 0.54 mg/dL — ABNORMAL LOW (ref 0.61–1.24)
GFR calc Af Amer: >60 mL/min (ref >60)
GFR calc non Af Amer: >60 mL/min (ref >60)
Glucose, Bld: 117 mg/dL — ABNORMAL HIGH (ref 70–99)
Potassium: 4 mmol/L (ref 3.5–5.1)
Sodium: 139 mmol/L (ref 135–145)

## 2019-05-17 LAB — COMPREHENSIVE METABOLIC PANEL
ALT: 26 U/L (ref 0–44)
AST: 24 U/L (ref 15–41)
Albumin: 3.2 g/dL — ABNORMAL LOW (ref 3.5–5.0)
Alkaline Phosphatase: 65 U/L (ref 38–126)
Anion gap: 13 (ref 5–15)
BUN: 37 mg/dL — ABNORMAL HIGH (ref 6–20)
CO2: 31 mmol/L (ref 22–32)
Calcium: 9.2 mg/dL (ref 8.9–10.3)
Chloride: 93 mmol/L — ABNORMAL LOW (ref 98–111)
Creatinine, Ser: 0.6 mg/dL — ABNORMAL LOW (ref 0.61–1.24)
GFR calc Af Amer: >60 mL/min (ref >60)
GFR calc non Af Amer: >60 mL/min (ref >60)
Glucose, Bld: 152 mg/dL — ABNORMAL HIGH (ref 70–99)
Potassium: 3.6 mmol/L (ref 3.5–5.1)
Sodium: 137 mmol/L (ref 135–145)
Total Bilirubin: 0.9 mg/dL (ref 0.3–1.2)
Total Protein: 6.2 g/dL — ABNORMAL LOW (ref 6.5–8.1)

## 2019-05-17 LAB — MAGNESIUM: Magnesium: 1.8 mg/dL (ref 1.7–2.4)

## 2019-05-17 LAB — CULTURE, RESPIRATORY W GRAM STAIN

## 2019-05-17 LAB — CBC
HCT: 41.3 % (ref 39.0–52.0)
Hemoglobin: 12.9 g/dL — ABNORMAL LOW (ref 13.0–17.0)
MCH: 26.8 pg (ref 26.0–34.0)
MCHC: 31.2 g/dL (ref 30.0–36.0)
MCV: 85.9 fL (ref 80.0–100.0)
Platelets: 722 10*3/uL — ABNORMAL HIGH (ref 150–400)
RBC: 4.81 MIL/uL (ref 4.22–5.81)
RDW: 19.4 % — ABNORMAL HIGH (ref 11.5–15.5)
WBC: 36.7 10*3/uL — ABNORMAL HIGH (ref 4.0–10.5)
nRBC: 0 % (ref 0.0–0.2)

## 2019-05-17 LAB — BRAIN NATRIURETIC PEPTIDE: B Natriuretic Peptide: 194.5 pg/mL — ABNORMAL HIGH (ref 0.0–100.0)

## 2019-05-17 LAB — PHOSPHORUS: Phosphorus: 2.2 mg/dL — ABNORMAL LOW (ref 2.5–4.6)

## 2019-05-17 LAB — CK TOTAL AND CKMB (NOT AT ARMC)
CK, MB: 2.5 ng/mL (ref 0.5–5.0)
Relative Index: INVALID (ref 0.0–2.5)
Total CK: 17 U/L — ABNORMAL LOW (ref 49–397)

## 2019-05-17 LAB — TROPONIN I (HIGH SENSITIVITY)
Troponin I (High Sensitivity): 26 ng/L — ABNORMAL HIGH (ref <18)
Troponin I (High Sensitivity): 24 ng/L — ABNORMAL HIGH (ref <18)

## 2019-05-17 LAB — SARS CORONAVIRUS 2 (TAT 6-24 HRS): SARS Coronavirus 2: NEGATIVE

## 2019-05-17 NOTE — ED Triage Notes (Signed)
[facility] nurse for c/o cp. Per nurse was having CP at 3 am with afib RVR and acute MI. Lopressor and Cardizem given and pt bact to SR. PT recovering from COVID, with NGT on tube feeding and trach. Denies any CP or SOB on ED arrival.

## 2019-05-17 NOTE — ED Provider Notes (Signed)
[facility] EMERGENCY DEPARTMENT Provider Note   CSN: [id number] Arrival date & time: 05/17/19  6237   History Chief Complaint  Patient presents with  . Chest Pain    Casey Wheeler is a 59 y.o. male.  The history is provided by the patient and medical records (and his treating physician at the [facility]).  Chest Pain He is a patient in [facility] where he is being treated for deconditioning secondary to COVID-19.  He had an episode of atrial fibrillation with rapid ventricular response and ECG reportedly showed STEMI.  He was given metoprolol without any improvement in rate, then given diltiazem with conversion to sinus rhythm.  Patient does state that he felt a little tightness in his chest when his heart rate was high, but feels like he is back to normal.  I was told by the treating physician, that the troponin was mildly elevated at 26.  ECG obtained after conversion did not show STEMI.  Apparently, he had been on amiodarone, but it had been discontinued for an unknown reason.  Past Medical History:  Diagnosis Date  . Acute on chronic respiratory failure with hypoxia ([place])   . Bilateral pneumothorax   . Chronic atrial fibrillation ([place])   . COVID-19 virus infection   . Healthcare-associated pneumonia   . Pneumonia due to COVID-19 virus     Patient Active Problem List   Diagnosis Date Noted  . Acute on chronic respiratory failure with hypoxia ([place])   . Chronic atrial fibrillation ([place])   . COVID-19 virus infection   . Pneumonia due to COVID-19 virus   . Bilateral pneumothorax   . Healthcare-associated pneumonia     History reviewed. No pertinent surgical history.     No family history on file.  Social History   Tobacco Use  . Smoking status: Never Smoker  . Smokeless tobacco: Never Used  Substance Use Topics  . Alcohol use: Never  . Drug use: Never    Home Medications Prior to Admission medications   Not on File     Allergies    Patient has no known allergies.  Review of Systems   Review of Systems  Cardiovascular: Positive for chest pain.  All other systems reviewed and are negative.   Physical Exam Updated Vital Signs BP 93/75 (BP Location: Right Arm)   Pulse 63   Temp 97.7 F (36.5 C) (Oral)   Resp 19   Ht 5\' 9"  (1.753 m)   Wt 80.3 kg   SpO2 96%   BMI 26.14 kg/m   Physical Exam Vitals and nursing note reviewed.   Somewhat cachectic 59 year old male, resting comfortably and in no acute distress. Vital signs are normal. Oxygen saturation is 96%, which is normal. Head is normocephalic and atraumatic. PERRLA, EOMI. Oropharynx is clear. Neck is nontender and supple without adenopathy or JVD.  Tracheostomy is in place. Back is nontender and there is no CVA tenderness. Lungs are clear without rales, wheezes, or rhonchi. Chest is nontender. Heart has regular rate and rhythm without murmur. Abdomen is soft, flat, nontender without masses or hepatosplenomegaly and peristalsis is normoactive. Extremities have no cyanosis or edema, full range of motion is present. Skin is warm and dry without rash. Neurologic: Mental status is normal, cranial nerves are intact, there are no motor or sensory deficits.  ED Results / Procedures / Treatments   Labs (all labs ordered are listed, but only abnormal results are displayed) Labs Reviewed  CBC WITH  DIFFERENTIAL/PLATELET - Abnormal; Notable for the following components:      Result Value   WBC 35.9 (*)    Hemoglobin 12.0 (*)    RDW 19.0 (*)    Platelets 571 (*)    All other components within normal limits  BASIC METABOLIC PANEL  TROPONIN I (HIGH SENSITIVITY)    EKG EKG Interpretation  Date/Time:  Monday May 17 2019 06:14:43 EST Ventricular Rate:  69 PR Interval:    QRS Duration: 91 QT Interval:  446 QTC Calculation: 478 R Axis:   26 Text Interpretation: Sinus rhythm Left ventricular hypertrophy Inferior infarct, old When  compared with ECG of EARLIER SAME DATE Sinus rhythm has replaced Atrial fibrillation with rapid ventricular response ST abnormality has resolved Confirmed by Dione Booze ([id number]) on 05/17/2019 6:40:05 AM   Radiology DG Chest Port 1 View  Result Date: 05/17/2019 CLINICAL DATA:  STEMI EXAM: PORTABLE CHEST 1 VIEW COMPARISON:  CT chest 05/08/2019 FINDINGS: Redemonstrated elevation of left hemidiaphragm with a complex left hydropneumothorax and adjacent passive atelectasis in the remaining aerated portions of the left lung. Overall appearance is similar to comparison CT accounting for differences in technique. Right lung is clear. No right effusion or pneumothorax. Left heart borders are largely obscured by overlying opacity. Calcified aorta is noted. Tracheostomy tube terminates in the mid trachea, transesophageal tube tip and side port distal to the GE junction terminating in the left upper quadrant. Long segment thoracolumbar fusion hardware is again noted. Additional support devices and telemetry leads overlie the chest. No acute osseous or soft tissue abnormality. Subacute to chronic deformities of the right seventh and eighth rib are again noted. IMPRESSION: Tracheostomy and transesophageal tubes are in satisfactory position. Elevation of left hemidiaphragm with complex left hydropneumothorax, similar to comparison CT. Opacity in the left lung is likely combination of passive atelectasis and layering pleural fluid. Right lung is clear. Cardiac borders are largely obscured. Subacute to chronic deformities of the right ribs, stable from prior. Electronically Signed   By: Kreg Shropshire M.D.   On: 05/17/2019 03:39    Procedures Procedures   Medications Ordered in ED Medications - No data to display  ED Course  I have reviewed the triage vital signs and the nursing notes.  Pertinent labs & imaging results that were available during my care of the patient were reviewed by me and considered in my medical  decision making (see chart for details).  MDM Rules/Calculators/A&P Paroxysmal atrial fibrillation which has resolved.  ECG here shows no evidence of STEMI.  I reviewed the ECGs from [facility] and there was isolated ST elevation in lead II which is felt to be rate related, and no findings actually suggestive of STEMI.  However, I am concerned about the fact that his amiodarone had been discontinued.  He probably should have cardiology consultation to see if that should be restarted.  Will check troponin here and, if stable, will refer back to [facility].  On further review of his records, he does have a history of atrial fibrillation.  I cannot find a medication list in the records that were sent with him.  Case is signed out to Dr. Anitra Lauth.  Final Clinical Impression(s) / ED Diagnoses Final diagnoses:  Paroxysmal atrial fibrillation (HCC)  Elevated troponin    Rx / DC Orders ED Discharge Orders    None       Dione Booze, MD 05/17/19 [phone]

## 2019-05-17 NOTE — ED Notes (Signed)
[facility] called and patient will be returning to [facility]. Jomarie Longs Consulting civil engineer called and aware of patient return

## 2019-05-17 NOTE — ED Notes (Signed)
Kairos Panetta- Wife- [phone]- would like pt updates

## 2019-05-17 NOTE — ED Provider Notes (Signed)
Patient's repeat troponin is unchanged from initial and is most likely mildly from A. fib RVR.  Patient remains in sinus rhythm here.  Patient has persistent leukocytosis and stable creatinine.  EKG showed normal sinus rhythm without acute findings.  Patient denies any chest discomfort or other acute issues at this time.  Feel that he is stable to be [facility]   Gwyneth Sprout, MD 05/17/19 [phone]

## 2019-05-17 NOTE — Progress Notes (Signed)
Pulmonary Critical Care Medicine [facility] [place]   PULMONARY CRITICAL CARE SERVICE  PROGRESS NOTE  Date of Service: 05/17/2019  Casey Wheeler  TIR:[id number]  DOB: 1961-02-26   DOA: 05/17/2019  Referring Physician: Carron Curie, MD  HPI: Casey Wheeler is a 59 y.o. male seen for follow up of Acute on Chronic Respiratory Failure.  Patient is capping doing well today would be 24 hours and is requiring about 5 L of oxygen  Medications: Reviewed on Rounds  Physical Exam:  Vitals: Temperature is 96.7 pulse 72 respiratory rate 16 blood pressure is 110/77 saturations 99%  Ventilator Settings capping right now on 5 L  . General: Comfortable at this time . Eyes: Grossly normal lids, irises & conjunctiva . ENT: grossly tongue is normal . Neck: no obvious mass . Cardiovascular: S1 S2 normal no gallop . Respiratory: No rhonchi or rales noted at this time . Abdomen: soft . Skin: no rash seen on limited exam . Musculoskeletal: not rigid . Psychiatric:unable to assess . Neurologic: no seizure no involuntary movements         Lab Data:   Basic Metabolic Panel: Recent Labs  Lab 05/11/19 0639 05/17/19 0319 05/17/19 0623  NA 136 137 139  K 4.4 3.6 4.0  CL 98 93* 98  CO2 26 31 27   GLUCOSE 118* 152* 117*  BUN 33* 37* 39*  CREATININE 0.42* 0.60* 0.54*  CALCIUM 9.0 9.2 8.8*  MG  --  1.8  --   PHOS  --  2.2*  --     ABG: No results for input(s): PHART, PCO2ART, PO2ART, HCO3, O2SAT in the last 168 hours.  Liver Function Tests: Recent Labs  Lab 05/17/19 0319  AST 24  ALT 26  ALKPHOS 65  BILITOT 0.9  PROT 6.2*  ALBUMIN 3.2*   No results for input(s): LIPASE, AMYLASE in the last 168 hours. No results for input(s): AMMONIA in the last 168 hours.  CBC: Recent Labs  Lab 05/11/19 0639 05/17/19 0319 05/17/19 0623  WBC 10.9* 36.7* 35.9*  NEUTROABS  --   --  34.5*  HGB 9.3* 12.9* 12.0*  HCT 30.4* 41.3 39.9  MCV 88.9 85.9 89.1  PLT 616* 722* 571*    Cardiac  Enzymes: Recent Labs  Lab 05/17/19 0319  CKTOTAL 17*  CKMB 2.5    BNP (last 3 results) Recent Labs    05/17/19 0321  BNP 194.5*    ProBNP (last 3 results) No results for input(s): PROBNP in the last 8760 hours.  Radiological Exams: DG Chest Port 1 View  Result Date: 05/17/2019 CLINICAL DATA:  STEMI EXAM: PORTABLE CHEST 1 VIEW COMPARISON:  CT chest 05/08/2019 FINDINGS: Redemonstrated elevation of left hemidiaphragm with a complex left hydropneumothorax and adjacent passive atelectasis in the remaining aerated portions of the left lung. Overall appearance is similar to comparison CT accounting for differences in technique. Right lung is clear. No right effusion or pneumothorax. Left heart borders are largely obscured by overlying opacity. Calcified aorta is noted. Tracheostomy tube terminates in the mid trachea, transesophageal tube tip and side port distal to the GE junction terminating in the left upper quadrant. Long segment thoracolumbar fusion hardware is again noted. Additional support devices and telemetry leads overlie the chest. No acute osseous or soft tissue abnormality. Subacute to chronic deformities of the right seventh and eighth rib are again noted. IMPRESSION: Tracheostomy and transesophageal tubes are in satisfactory position. Elevation of left hemidiaphragm with complex left hydropneumothorax, similar to comparison CT. Opacity in the  left lung is likely combination of passive atelectasis and layering pleural fluid. Right lung is clear. Cardiac borders are largely obscured. Subacute to chronic deformities of the right ribs, stable from prior. Electronically Signed   By: Lovena Le M.D.   On: 05/17/2019 03:39    Assessment/Plan Active Problems:   Acute on chronic respiratory failure with hypoxia (HCC)   Chronic atrial fibrillation ([place])   COVID-19 virus infection   Pneumonia due to COVID-19 virus   Bilateral pneumothorax   Healthcare-associated pneumonia   1. Acute  on chronic respiratory failure with hypoxia plan is to continue with capping trials patient is currently on 24-hour goal 2. Chronic atrial fibrillation rate is controlled patient was seen in the ER appears for acute decompensation 3. Bilateral pneumothorax resolved status post chest tube placement 4. COVID-19 virus infection resolved 5. Pneumonia due to COVID-19 treated 6. Healthcare associated pneumonia treated resolved we will monitor radiologically   I have personally seen and evaluated the patient, evaluated laboratory and imaging results, formulated the assessment and plan and placed orders. The Patient requires high complexity decision making with multiple systems involvement.  Rounds were done with the Respiratory Therapy Director and Staff therapists and discussed with nursing staff also.  Allyne Gee, MD [facility] Pulmonary Critical Care Medicine Sleep Medicine

## 2019-05-17 NOTE — Progress Notes (Addendum)
   Request made for Left hydropneumothorax chest tube placement  I have reviewed chart and imaging with Dr Loreta Ave He approves aspiration only Send for labs and consider drain placement if not sterile  Call to wife and son-- both NA  Will try in am  Covid rapid test and INR pending  IR PA will see pt in am  I spoke to Jomarie Longs RN He is aware of plan

## 2019-05-18 ENCOUNTER — Other Ambulatory Visit (HOSPITAL_COMMUNITY): Payer: Self-pay

## 2019-05-18 DIAGNOSIS — I482 Chronic atrial fibrillation, unspecified: Secondary | ICD-10-CM | POA: Diagnosis not present

## 2019-05-18 DIAGNOSIS — J939 Pneumothorax, unspecified: Secondary | ICD-10-CM | POA: Diagnosis not present

## 2019-05-18 DIAGNOSIS — J9621 Acute and chronic respiratory failure with hypoxia: Secondary | ICD-10-CM | POA: Diagnosis not present

## 2019-05-18 DIAGNOSIS — U071 COVID-19: Secondary | ICD-10-CM | POA: Diagnosis not present

## 2019-05-18 LAB — PROTIME-INR
INR: 1.1 (ref 0.8–1.2)
Prothrombin Time: 13.9 seconds (ref 11.4–15.2)

## 2019-05-18 NOTE — Consult Note (Signed)
Chief Complaint: Patient was seen in consultation today for left hydropneumothorax chest tube drain at the request of Dr Laren Everts   Supervising Physician: Aletta Edouard  Patient Status: [facility]  History of Present Illness: Casey Wheeler is a 59 y.o. male   Hx etoh abuse; Hx Afib (rate controlled) Known Covid + Dec 2020 Developed Hypoxia and required admission; intubation/vent- even trach Admitted to [facility] 05/06/19 Management  Covid neg 05/17/19 Off vent  Still SOB and requires 5L 02   CT 2/13: IMPRESSION: 1. Posteromedial loculated/contained left hydropneumothorax, 11.3 by 5.9 by 3.4 cm (volume = 120 cubic cm). 2. Bilateral lower lobe airspace opacities, left greater than right, a component of which is due to atelectasis, although there may be some superimposed consolidative airspace opacity particularly on the left.  CXR yesterday: IMPRESSION: Tracheostomy and transesophageal tubes are in satisfactory position. Elevation of left hemidiaphragm with complex left hydropneumothorax, similar to comparison CT. Opacity in the left lung is likely combination of passive atelectasis and layering pleural fluid. Right lung is clear. Cardiac borders are largely obscured. Subacute to chronic deformities of the right ribs, stable from Prior.  Request made for left chest tube drain Imaging reviewed with Dr Earleen Newport and Dr Kathlene Cote Scheduled for left chest tube drain today in IR  Past Medical History:  Diagnosis Date  . Acute on chronic respiratory failure with hypoxia ([place])   . Bilateral pneumothorax   . Chronic atrial fibrillation ([place])   . COVID-19 virus infection   . Healthcare-associated pneumonia   . Pneumonia due to COVID-19 virus     No past surgical history on file.  Allergies: Patient has no known allergies.  Medications: Prior to Admission medications   Not on File     No family history on file.  Social History   Socioeconomic History  . Marital  status: Married    Spouse name: Not on file  . Number of children: Not on file  . Years of education: Not on file  . Highest education level: Not on file  Occupational History  . Not on file  Tobacco Use  . Smoking status: Never Smoker  . Smokeless tobacco: Never Used  Substance and Sexual Activity  . Alcohol use: Never  . Drug use: Never  . Sexual activity: Not on file  Other Topics Concern  . Not on file  Social History Narrative  . Not on file   Social Determinants of Health   Financial Resource Strain:   . Difficulty of Paying Living Expenses: Not on file  Food Insecurity:   . Worried About Charity fundraiser in the Last Year: Not on file  . Ran Out of Food in the Last Year: Not on file  Transportation Needs:   . Lack of Transportation (Medical): Not on file  . Lack of Transportation (Non-Medical): Not on file  Physical Activity:   . Days of Exercise per Week: Not on file  . Minutes of Exercise per Session: Not on file  Stress:   . Feeling of Stress : Not on file  Social Connections:   . Frequency of Communication with Friends and Family: Not on file  . Frequency of Social Gatherings with Friends and Family: Not on file  . Attends Religious Services: Not on file  . Active Member of Clubs or Organizations: Not on file  . Attends Archivist Meetings: Not on file  . Marital Status: Not on file    Review of Systems: A 12 point ROS  discussed and pertinent positives are indicated in the HPI above.  All other systems are negative.   Vital Signs: There were no vitals taken for this visit.  Physical Exam Vitals reviewed.  Cardiovascular:     Rate and Rhythm: Rhythm irregular.     Heart sounds: Normal heart sounds.  Skin:    General: Skin is warm and dry.  Neurological:     Mental Status: He is alert.  Psychiatric:     Comments: Spoke to wife Rinaldo Cloud via phone For consent-- done and in IR     Imaging: CT ABDOMEN WO CONTRAST  Result Date:  05/07/2019 CLINICAL DATA:  Evaluate anatomy prior to potential percutaneous gastrostomy tube placement. EXAM: CT ABDOMEN WITHOUT CONTRAST TECHNIQUE: Multidetector CT imaging of the abdomen was performed following the standard protocol without IV contrast. COMPARISON:  None. FINDINGS: The lack of intravenous contrast limits the ability to evaluate solid abdominal organs. Lower chest: Limited visualization of the lower thorax demonstrates an age-indeterminate hydropneumothorax involving the medial aspect of the imaged left lung base (representative image 1, series 3). Note, the superior most aspect of the hydropneumothorax is not imaged. Consolidative opacities with associated air bronchograms within the bilateral lung bases, left greater than right. Advanced emphysematous change. Normal heart size. Coronary artery calcifications. Trace amount of pericardial fluid, presumably physiologic. Hepatobiliary: Normal hepatic contour. Note is made of a punctate (approximately 0.9 cm) stone lying dependently within the neck of an otherwise normal-appearing gallbladder. No ascites. Pancreas: Normal noncontrast appearance of the pancreas. Spleen: The spleen is diminutive and dysmorphic in appearance, potentially posttraumatic in etiology. Adrenals/Urinary Tract: Normal noncontrast appearance of the bilateral kidneys. No renal stones. No renal stones are seen along expected course of either ureter. No urinary obstruction or perinephric stranding. Normal noncontrast appearance the bilateral adrenal glands. The urinary bladder was not imaged. Stomach/Bowel: Enteric tube tip terminates within the gastric antrum. Both hepatic parenchyma as well as the transverse colon are interposed between the anterior wall the stomach and ventral wall of the abdomen, precluding percutaneous gastrostomy tube placement. Scattered colonic diverticulosis without evidence of superimposed acute diverticulitis. No pneumoperitoneum, pneumatosis or portal  venous gas. Vascular/Lymphatic: Atherosclerotic plaque within normal caliber abdominal aorta. No bulky retroperitoneal or mesenteric adenopathy on this noncontrast examination. Other: Note is made of an age-indeterminate retroperitoneal hematoma affecting the left iliacus musculature (image 62, series 3). Musculoskeletal: Post long segment thoracolumbar paraspinal fusion and post intervertebral disc space replacement of L2-L3, L3-L4, L4-L5 and L5-S1. No definite evidence of hardware failure or loosening. IMPRESSION: 1. Gastric anatomy not amenable to percutaneous gastrostomy tube placement due to interposition of the hepatic parenchyma and transverse colon. If gastrostomy tube placement is still required, recommend surgical consultation. 2. Age-indeterminate hydropneumothorax within the imaged medial aspect the left lung base. 3. Bibasilar consolidative opacities with associated air bronchograms, left greater than right, potentially atelectasis though worrisome for multifocal infection/aspiration. 4. Age-indeterminate though potentially chronic left-sided retroperitoneal hematoma affecting the left iliacus musculature. Correlation with hemodynamic stability and serial H&Hs could be performed as indicated. 5. Post long segment thoracolumbar paraspinal fusion and multilevel intervertebral disc space replacement without evidence of hardware failure or loosening 6. Coronary calcifications.  Aortic Atherosclerosis (ICD10-I70.0). 7. Cholelithiasis without evidence of cholecystitis on this noncontrast examination. Critical Value/emergent results were called by telephone at the time of interpretation on 05/07/2019 at 12:48 pm to provider [facility] , who verbally acknowledged these results. Electronically Signed   By: Simonne Come M.D.   On: 05/07/2019 12:59   CT  CHEST W CONTRAST  Result Date: 05/08/2019 CLINICAL DATA:  Pneumothorax EXAM: CT CHEST WITH CONTRAST TECHNIQUE: Multidetector CT imaging of the chest was  performed during intravenous contrast administration. CONTRAST:  70mL OMNIPAQUE IOHEXOL 300 MG/ML  SOLN COMPARISON:  CT abdomen 05/07/2019 and chest radiograph from 05/07/2019 FINDINGS: Cardiovascular: Coronary, aortic arch, and branch vessel atherosclerotic vascular disease. Small pericardial effusion. Mediastinum/Nodes: Tracheostomy tube noted. A nasogastric tube enters the stomach. Subcarinal node 1.1 cm in short axis, image 59/3. Right lower paratracheal node 0.9 cm in short axis, image 53/3. Left paratracheal node 1.2 cm in short axis, image 49/3. Left infrahilar lymph node 0.7 cm in short axis, image 61/3. Lungs/Pleura: Posteromedial loculated/contained left hydropneumothorax, measuring 11.3 by 5.9 by 3.4 cm (volume = 120 cm^3). Centrilobular emphysema. Subsegmental atelectasis or scarring in the lingula. Bilateral lower lobe airspace opacities, left greater than right, a component of which is due to atelectasis, although there may be some superimposed consolidative airspace opacity particularly on the left. Trace right pleural effusion. Upper Abdomen: Cholelithiasis. Thick-walled, contracted gallbladder. Elevated left hemidiaphragm. Small rounded spleen versus accessory regenerative splenic tissue. Nonspecific hypodense lesion in the right mid upper kidney laterally on image 156/3. Musculoskeletal: Nonunited left fourth rib fracture medially on the right. Nonunited right sixth and eighth rib fractures posteriorly. Deformities from old left fourth, fifth, sixth, seventh, eighth, ninth, and tenth rib fractures posteriorly on the left side, with nonunion of the sixth and ninth rib fractures. Posterolateral rod and pedicle screw fixation at T4-T5-T6-T7-T8-T9-T11-T12-L1-L2 and extending caudad. No pedicle screws at the T10 level. Chronic mild compression at T10. Chronic appearing inferior endplate compression at T8. Chronic appearing superior endplate compression at L2. IMPRESSION: 1. Posteromedial  loculated/contained left hydropneumothorax, 11.3 by 5.9 by 3.4 cm (volume = 120 cubic cm). 2. Bilateral lower lobe airspace opacities, left greater than right, a component of which is due to atelectasis, although there may be some superimposed consolidative airspace opacity particularly on the left. 3. Coronary, aortic arch, and branch vessel atherosclerotic vascular disease. 4. Small pericardial effusion. 5. Mild mediastinal adenopathy, probably reactive. 6. Cholelithiasis. 7. Trace right pleural effusion. 8. Elevated left hemidiaphragm. 9. Nonspecific hypodense lesion in the right mid upper kidney laterally. 10. Bilateral chronic rib fractures, some nonunited. 11. Posterolateral rod and pedicle screw fixation visualized at T4-T5-T6-T7-T8-T9-T11-T12-L1-L2. 12. Aortic Atherosclerosis (ICD10-I70.0) and Emphysema (ICD10-J43.9). Electronically Signed   By: Gaylyn Rong M.D.   On: 05/08/2019 13:10   DG Chest Port 1 View  Result Date: 05/17/2019 CLINICAL DATA:  STEMI EXAM: PORTABLE CHEST 1 VIEW COMPARISON:  CT chest 05/08/2019 FINDINGS: Redemonstrated elevation of left hemidiaphragm with a complex left hydropneumothorax and adjacent passive atelectasis in the remaining aerated portions of the left lung. Overall appearance is similar to comparison CT accounting for differences in technique. Right lung is clear. No right effusion or pneumothorax. Left heart borders are largely obscured by overlying opacity. Calcified aorta is noted. Tracheostomy tube terminates in the mid trachea, transesophageal tube tip and side port distal to the GE junction terminating in the left upper quadrant. Long segment thoracolumbar fusion hardware is again noted. Additional support devices and telemetry leads overlie the chest. No acute osseous or soft tissue abnormality. Subacute to chronic deformities of the right seventh and eighth rib are again noted. IMPRESSION: Tracheostomy and transesophageal tubes are in satisfactory position.  Elevation of left hemidiaphragm with complex left hydropneumothorax, similar to comparison CT. Opacity in the left lung is likely combination of passive atelectasis and layering pleural fluid. Right lung is  clear. Cardiac borders are largely obscured. Subacute to chronic deformities of the right ribs, stable from prior. Electronically Signed   By: Kreg Shropshire M.D.   On: 05/17/2019 03:39   DG Chest Port 1 View  Result Date: 05/07/2019 CLINICAL DATA:  Respiratory failure EXAM: PORTABLE CHEST 1 VIEW COMPARISON:  05/05/2018 FINDINGS: Cardiac shadow is stable. Gastric catheter tracheostomy tube are noted in satisfactory position. Elevation of the left hemidiaphragm is again noted. Left basilar atelectasis is seen. The overall degree of aeration on the left has improved significantly in the interval from the prior exam. Old rib fractures are noted on the right. Surgical hardware is noted throughout the thoracolumbar spine. IMPRESSION: Tubes and lines as described above. Left basilar atelectasis with significant improved aeration on the left when compared with the prior exam. Electronically Signed   By: Alcide Clever M.D.   On: 05/07/2019 09:38   DG Chest Port 1 View  Result Date: 05/06/2019 CLINICAL DATA:  Respiratory distress EXAM: PORTABLE CHEST 1 VIEW COMPARISON:  05/01/2019 chest radiograph. FINDINGS: Tracheostomy tube tip overlies the tracheal air column at the level of the thoracic inlet. Enteric tube enters stomach with the tip not seen on this image. Stable cardiomediastinal silhouette with normal heart size. No pneumothorax. No right pleural effusion. Interval significant volume loss in the left hemithorax with near complete opacification of the left hemithorax, significantly worsened. Decreased hazy right lung opacities. IMPRESSION: 1. Significantly worsened volume loss in the left hemithorax with near complete opacification of the left hemithorax. Findings suggest central left airway mucoid impaction  with near complete left lung atelectasis and/or pneumonia. 2. Decreased hazy right lung opacities. Electronically Signed   By: Delbert Phenix M.D.   On: 05/06/2019 09:06   DG CHEST PORT 1 VIEW  Result Date: 05/01/2019 CLINICAL DATA:  Tracheostomy status EXAM: PORTABLE CHEST 1 VIEW COMPARISON:  None. FINDINGS: Tracheostomy projects over the mid trachea. Heart is upper limits normal in size. Bilateral interstitial/airspace opacities could reflect edema or infection. Elevation of the left hemidiaphragm. Postoperative changes in the thoracic spine. IMPRESSION: Tracheostomy projects over the mid trachea. Interstitial and airspace opacities diffusely could reflect edema or infection. Electronically Signed   By: Charlett Nose M.D.   On: 05/01/2019 21:20   DG Abd Portable 1V  Result Date: 05/03/2019 CLINICAL DATA:  NG tube repositioned EXAM: PORTABLE ABDOMEN - 1 VIEW COMPARISON:  05/03/2019 FINDINGS: NG tube tip is in the mid stomach. Nonobstructive bowel gas pattern. IMPRESSION: NG tube tip in the mid stomach. Electronically Signed   By: Charlett Nose M.D.   On: 05/03/2019 22:14   DG Abd Portable 1V  Result Date: 05/03/2019 CLINICAL DATA:  59 year old male with NG placement. EXAM: PORTABLE ABDOMEN - 1 VIEW COMPARISON:  Abdominal radiograph dated 05/01/2019. FINDINGS: An enteric tube is noted with tip in the left upper abdomen over the gastric air. Nonobstructing bowel gas and extensive lumbar spine surgical changes. IMPRESSION: Enteric tube with tip in the left upper abdomen in the stomach. Electronically Signed   By: Elgie Collard M.D.   On: 05/03/2019 17:13   DG Abd Portable 1V  Result Date: 05/01/2019 CLINICAL DATA:  Tracheostomy status EXAM: PORTABLE ABDOMEN - 1 VIEW COMPARISON:  None. FINDINGS: Feeding tube coils in the stomach. Nonobstructive bowel gas pattern. IMPRESSION: Feeding tube coils in the stomach. Electronically Signed   By: Charlett Nose M.D.   On: 05/01/2019 21:19    Labs:  CBC: Recent  Labs    05/07/19 0505 05/11/19 3614  05/17/19 0319 05/17/19 0623  WBC 21.8* 10.9* 36.7* 35.9*  HGB 10.0* 9.3* 12.9* 12.0*  HCT 32.5* 30.4* 41.3 39.9  PLT 551* 616* 722* 571*    COAGS: Recent Labs    05/18/19 0709  INR 1.1    BMP: Recent Labs    05/09/19 0805 05/11/19 0639 05/17/19 0319 05/17/19 0623  NA 139 136 137 139  K 4.2 4.4 3.6 4.0  CL 101 98 93* 98  CO2 29 26 31 27   GLUCOSE 118* 118* 152* 117*  BUN 32* 33* 37* 39*  CALCIUM 9.7 9.0 9.2 8.8*  CREATININE 0.47* 0.42* 0.60* 0.54*  GFRNONAA >60 >60 >60 >60  GFRAA >60 >60 >60 >60    LIVER FUNCTION TESTS: Recent Labs    05/09/19 0805 05/17/19 0319  BILITOT  --  0.9  AST  --  24  ALT  --  26  ALKPHOS  --  65  PROT  --  6.2*  ALBUMIN 2.8* 3.2*    TUMOR MARKERS: No results for input(s): AFPTM, CEA, CA199, CHROMGRNA in the last 8760 hours.  Assessment and Plan:  Hyoxia; SOB Post Covid infection Dec 2020 Covid neg 05/17/19 Left hydropneumothorax--- for chest tube drain placement today Risks and benefits discussed with the patient including bleeding, infection, damage to adjacent structures,and sepsis.  All of  wife 05/19/19 via phone questions were answered, she is agreeable to proceed. Consent signed and in chart.  Thank you for this interesting consult.  I greatly enjoyed meeting Jeanpaul Biehl and look forward to participating in their care.  A copy of this report was sent to the requesting provider on this date.  Electronically Signed: Shella Maxim, PA-C 05/18/2019, 8:17 AM   I spent a total of 40 Minutes    in face to face in clinical consultation, greater than 50% of which was counseling/coordinating care for left hydroptx chest tube drain

## 2019-05-18 NOTE — Progress Notes (Signed)
IR requested by Dr. Sharyon Medicus for management of left hydropneumothorax.  Case has been reviewed by Dr. Loreta Ave and Dr. Fredia Sorrow who approved procedure. Due to scheduling in IR department, plan for image-guided left chest aspiration/drainage tomorrow 05/19/2019 in IR. Patient has been seen/consent obtained by wife for procedure. Patient to be NPO at midnight. Hold all blood thinners until post-procedure. 5E RN aware.  Please call IR with questions/concerns.   Waylan Boga Derrisha Foos, PA-C 05/18/2019, 3:11 PM

## 2019-05-18 NOTE — Progress Notes (Signed)
Called to floor to advise that we will not be able to do this pt procedure today. PA to change orders to reflect this. We will call for pt tomorrow as schedule allows.

## 2019-05-18 NOTE — Progress Notes (Signed)
Attempted to send for pt. RN is not available to come down with pt at this time per secretary.

## 2019-05-18 NOTE — Progress Notes (Addendum)
Pulmonary Critical Care Medicine [place]   PULMONARY CRITICAL CARE SERVICE  PROGRESS NOTE  Date of Service: 05/18/2019  Casey Wheeler  ZYS:[id number]  DOB: [birth year]   DOA: 05/17/2019  Referring Physician: Merton Border, MD  HPI: Casey Wheeler is a 59 y.o. male seen for follow up of Acute on Chronic Respiratory Failure.  Patient is capping at this time is on 5 L of oxygen, is on the goal of 48 hours seems to be tolerating it well so far.  Medications: Reviewed on Rounds  Physical Exam:  Vitals: Temperature is 96.9 pulse 69 respiratory rate 18 blood pressure is 90/55 saturations 100%  Ventilator Settings capping on 5 L of oxygen  . General: Comfortable at this time . Eyes: Grossly normal lids, irises & conjunctiva . ENT: grossly tongue is normal . Neck: no obvious mass . Cardiovascular: S1 S2 normal no gallop . Respiratory: No rhonchi coarse breath sounds are noted . Abdomen: soft . Skin: no rash seen on limited exam . Musculoskeletal: not rigid . Psychiatric:unable to assess . Neurologic: no seizure no involuntary movements         Lab Data:   Basic Metabolic Panel: Recent Labs  Lab 05/17/19 0319 05/17/19 0623  NA 137 139  K 3.6 4.0  CL 93* 98  CO2 31 27  GLUCOSE 152* 117*  BUN 37* 39*  CREATININE 0.60* 0.54*  CALCIUM 9.2 8.8*  MG 1.8  --   PHOS 2.2*  --     ABG: No results for input(s): PHART, PCO2ART, PO2ART, HCO3, O2SAT in the last 168 hours.  Liver Function Tests: Recent Labs  Lab 05/17/19 0319  AST 24  ALT 26  ALKPHOS 65  BILITOT 0.9  PROT 6.2*  ALBUMIN 3.2*   No results for input(s): LIPASE, AMYLASE in the last 168 hours. No results for input(s): AMMONIA in the last 168 hours.  CBC: Recent Labs  Lab 05/17/19 0319 05/17/19 0623  WBC 36.7* 35.9*  NEUTROABS  --  34.5*  HGB 12.9* 12.0*  HCT 41.3 39.9  MCV 85.9 89.1  PLT 722* 571*    Cardiac Enzymes: Recent Labs  Lab 05/17/19 0319  CKTOTAL 17*  CKMB 2.5     BNP (last 3 results) Recent Labs    05/17/19 0321  BNP 194.5*    ProBNP (last 3 results) No results for input(s): PROBNP in the last 8760 hours.  Radiological Exams: DG Chest Port 1 View  Result Date: 05/17/2019 CLINICAL DATA:  STEMI EXAM: PORTABLE CHEST 1 VIEW COMPARISON:  CT chest 05/08/2019 FINDINGS: Redemonstrated elevation of left hemidiaphragm with a complex left hydropneumothorax and adjacent passive atelectasis in the remaining aerated portions of the left lung. Overall appearance is similar to comparison CT accounting for differences in technique. Right lung is clear. No right effusion or pneumothorax. Left heart borders are largely obscured by overlying opacity. Calcified aorta is noted. Tracheostomy tube terminates in the mid trachea, transesophageal tube tip and side port distal to the GE junction terminating in the left upper quadrant. Long segment thoracolumbar fusion hardware is again noted. Additional support devices and telemetry leads overlie the chest. No acute osseous or soft tissue abnormality. Subacute to chronic deformities of the right seventh and eighth rib are again noted. IMPRESSION: Tracheostomy and transesophageal tubes are in satisfactory position. Elevation of left hemidiaphragm with complex left hydropneumothorax, similar to comparison CT. Opacity in the left lung is likely combination of passive atelectasis and layering pleural fluid. Right lung is clear. Cardiac borders  are largely obscured. Subacute to chronic deformities of the right ribs, stable from prior. Electronically Signed   By: Kreg Shropshire M.D.   On: 05/17/2019 03:39    Assessment/Plan Active Problems:   Acute on chronic respiratory failure with hypoxia ([facility])   Chronic atrial fibrillation ([facility])   COVID-19 virus infection   Pneumonia due to COVID-19 virus   Bilateral pneumothorax   Healthcare-associated pneumonia   1. Acute on chronic respiratory failure with hypoxia continue with capping  trials as tolerated continue secretion management pulmonary toilet. 2. COVID-19 virus infection in resolution phase. 3. Bilateral hydropneumothorax last chest x-ray that was done revealed a complex left hydropneumothorax which has been noted on the CT patient is probably going to require a catheter placement also drainage of a empyema 4. Chronic atrial fibrillation rate is controlled at this time 5. Pneumonia due to COVID-19 with residual changes along with probable empyema 6. Healthcare associated pneumonia treated we will continue with supportive care   I have personally seen and evaluated the patient, evaluated laboratory and imaging results, formulated the assessment and plan and placed orders. The Patient requires high complexity decision making with multiple systems involvement.  Rounds were done with the Respiratory Therapy Director and Staff therapists and discussed with nursing staff also.  Yevonne Pax, MD [facility] Pulmonary Critical Care Medicine Sleep Medicine

## 2019-05-19 ENCOUNTER — Other Ambulatory Visit (HOSPITAL_COMMUNITY): Payer: Self-pay

## 2019-05-19 DIAGNOSIS — I482 Chronic atrial fibrillation, unspecified: Secondary | ICD-10-CM

## 2019-05-19 DIAGNOSIS — J9621 Acute and chronic respiratory failure with hypoxia: Secondary | ICD-10-CM | POA: Diagnosis not present

## 2019-05-19 DIAGNOSIS — U071 COVID-19: Secondary | ICD-10-CM | POA: Diagnosis not present

## 2019-05-19 DIAGNOSIS — J939 Pneumothorax, unspecified: Secondary | ICD-10-CM | POA: Diagnosis not present

## 2019-05-19 DIAGNOSIS — J1282 Pneumonia due to coronavirus disease 2019: Secondary | ICD-10-CM

## 2019-05-19 DIAGNOSIS — J189 Pneumonia, unspecified organism: Secondary | ICD-10-CM

## 2019-05-19 LAB — CBC
HCT: 36.5 % — ABNORMAL LOW (ref 39.0–52.0)
Hemoglobin: 11.4 g/dL — ABNORMAL LOW (ref 13.0–17.0)
MCH: 27.2 pg (ref 26.0–34.0)
MCHC: 31.2 g/dL (ref 30.0–36.0)
MCV: 87.1 fL (ref 80.0–100.0)
Platelets: 620 10*3/uL — ABNORMAL HIGH (ref 150–400)
RBC: 4.19 MIL/uL — ABNORMAL LOW (ref 4.22–5.81)
RDW: 19 % — ABNORMAL HIGH (ref 11.5–15.5)
WBC: 31.9 10*3/uL — ABNORMAL HIGH (ref 4.0–10.5)
nRBC: 0 % (ref 0.0–0.2)

## 2019-05-19 LAB — BASIC METABOLIC PANEL
Anion gap: 10 (ref 5–15)
BUN: 33 mg/dL — ABNORMAL HIGH (ref 6–20)
CO2: 29 mmol/L (ref 22–32)
Calcium: 9.1 mg/dL (ref 8.9–10.3)
Chloride: 100 mmol/L (ref 98–111)
Creatinine, Ser: 0.46 mg/dL — ABNORMAL LOW (ref 0.61–1.24)
GFR calc Af Amer: >60 mL/min (ref >60)
GFR calc non Af Amer: >60 mL/min (ref >60)
Glucose, Bld: 97 mg/dL (ref 70–99)
Potassium: 3.5 mmol/L (ref 3.5–5.1)
Sodium: 139 mmol/L (ref 135–145)

## 2019-05-19 LAB — VANCOMYCIN, TROUGH
Vancomycin Tr: 6 ug/mL — ABNORMAL LOW (ref 15–20)
Vancomycin Tr: 23 ug/mL (ref 15–20)

## 2019-05-19 MED ORDER — MIDAZOLAM HCL 2 MG/2ML IJ SOLN
INTRAMUSCULAR | Status: AC
  Filled 2019-05-19: qty 2

## 2019-05-19 MED ORDER — FENTANYL CITRATE (PF) 100 MCG/2ML IJ SOLN
INTRAMUSCULAR | Status: AC
  Filled 2019-05-19: qty 2

## 2019-05-19 MED ORDER — FENTANYL CITRATE (PF) 100 MCG/2ML IJ SOLN
INTRAMUSCULAR | Status: AC | PRN
  Administered 2019-05-19: 50 ug via INTRAVENOUS

## 2019-05-19 NOTE — Progress Notes (Addendum)
Pulmonary Critical Care Medicine [facility]   PULMONARY CRITICAL CARE SERVICE  PROGRESS NOTE  Date of Service: 05/19/2019  Casey Wheeler  AOZ:[id number]  DOB: [birth year]   DOA: 05/17/2019  Referring Physician: Carron Curie, MD  HPI: Casey Wheeler is a 59 y.o. male seen for follow up of Acute on Chronic Respiratory Failure.  Patient has now been capped for 72 hours using 4 L Oxymizer at this time satting well no distress.  Medications: Reviewed on Rounds  Physical Exam:  Vitals: Pulse 78 respirations 22 BP 126/74 O2 sat 94% temp 96.7  Ventilator Settings 4 L Oxymizer  . General: Comfortable at this time . Eyes: Grossly normal lids, irises & conjunctiva . ENT: grossly tongue is normal . Neck: no obvious mass . Cardiovascular: S1 S2 normal no gallop . Respiratory: No rales or rhonchi noted . Abdomen: soft . Skin: no rash seen on limited exam . Musculoskeletal: not rigid . Psychiatric:unable to assess . Neurologic: no seizure no involuntary movements         Lab Data:   Basic Metabolic Panel: Recent Labs  Lab 05/17/19 0319 05/17/19 0623 05/19/19 1048  NA 137 139 139  K 3.6 4.0 3.5  CL 93* 98 100  CO2 31 27 29   GLUCOSE 152* 117* 97  BUN 37* 39* 33*  CREATININE 0.60* 0.54* 0.46*  CALCIUM 9.2 8.8* 9.1  MG 1.8  --   --   PHOS 2.2*  --   --     ABG: No results for input(s): PHART, PCO2ART, PO2ART, HCO3, O2SAT in the last 168 hours.  Liver Function Tests: Recent Labs  Lab 05/17/19 0319  AST 24  ALT 26  ALKPHOS 65  BILITOT 0.9  PROT 6.2*  ALBUMIN 3.2*   No results for input(s): LIPASE, AMYLASE in the last 168 hours. No results for input(s): AMMONIA in the last 168 hours.  CBC: Recent Labs  Lab 05/17/19 0319 05/17/19 0623 05/19/19 1048  WBC 36.7* 35.9* 31.9*  NEUTROABS  --  34.5*  --   HGB 12.9* 12.0* 11.4*  HCT 41.3 39.9 36.5*  MCV 85.9 89.1 87.1  PLT 722* 571* 620*    Cardiac Enzymes: Recent Labs  Lab 05/17/19 0319   CKTOTAL 17*  CKMB 2.5    BNP (last 3 results) Recent Labs    05/17/19 0321  BNP 194.5*    ProBNP (last 3 results) No results for input(s): PROBNP in the last 8760 hours.  Radiological Exams: DG CHEST PORT 1 VIEW  Result Date: 05/18/2019 CLINICAL DATA:  Acute on chronic respiratory failure. EXAM: PORTABLE CHEST 1 VIEW COMPARISON:  Chest x-ray dated 05/17/2019 and chest CT dated 05/08/2019 FINDINGS: The heart size and pulmonary vascularity are normal in the right lung is clear. Improved aeration in the left upper lobe with decreased left hydropneumothorax, primarily loculated posterior medially. Likely compressive atelectasis in the left lower lobe. NG tube tip is below the diaphragm. No acute bone abnormality. Tracheostomy tube in place. Coronary artery stent or dense coronary artery calcification is noted. IMPRESSION: Improved aeration in the left upper lobe. Slightly decreased left hydropneumothorax. Probable adjacent atelectasis in the left lower lobe. Electronically Signed   By: 05/10/2019 M.D.   On: 05/18/2019 16:47   CT IMAGE GUIDED FLUID DRAIN BY CATHETER  Result Date: 05/19/2019 INDICATION: 59 year old male with loculated left pleural fluid possible empyema EXAM: CT-GUIDED DRAINAGE OF LEFT PLEURAL FLUID MEDICATIONS: The patient is currently admitted to the hospital and receiving intravenous antibiotics. The antibiotics  were administered within an appropriate time frame prior to the initiation of the procedure. ANESTHESIA/SEDATION: 0 mg IV Versed 50 mcg IV Fentanyl Moderate Sedation Time:  None The patient was continuously monitored during the procedure by the interventional radiology nurse under my direct supervision. COMPLICATIONS: None TECHNIQUE: Informed written consent was obtained from the patient after a thorough discussion of the procedural risks, benefits and alternatives. All questions were addressed. Maximal Sterile Barrier Technique was utilized including caps, mask,  sterile gowns, sterile gloves, sterile drape, hand hygiene and skin antiseptic. A timeout was performed prior to the initiation of the procedure. PROCEDURE: The operative field was prepped with chlorhexidine in a sterile fashion, and a sterile drape was applied covering the operative field. A sterile gown and sterile gloves were used for the procedure. Local anesthesia was provided with 1% Lidocaine. Patient was in the left decubitus position when scout CT was performed. 1% lidocaine was used for local anesthesia. Using modified Seldinger technique, 10 French drain was placed into the fluid and gas collection of the left posterior pleural space. Once the drain was in place approximately 10 cc of murky brown fluid aspirated for a sample. Drain was left in place attached to a pleura vac. Sutured in position. Patient tolerated the procedure well and remained hemodynamically stable throughout. No complications were encountered and no significant blood loss. FINDINGS: Fluid and gas collection persists of the left posterior pleural space, potentially small slightly smaller than the comparison CT. Proximally 10 cc of murky brown fluid was sent for culture IMPRESSION: Status post left pleural drain placement for presumed empyema. Signed, Dulcy Fanny. Dellia Nims, RPVI [facility] Inter[facility] Radiology [facility] Radiology Electronically Signed   By: Corrie Mckusick D.O.   On: 05/19/2019 10:30    Assessment/Plan Active Problems:   Acute on chronic respiratory failure with hypoxia (HCC)   Chronic atrial fibrillation ([place])   COVID-19 virus infection   Pneumonia due to COVID-19 virus   Bilateral pneumothorax   Healthcare-associated pneumonia   1. Acute on chronic respiratory failure with hypoxia patient is now in Her 72 hours on 4 L Oxymizer plan is to decannulate tomorrow continue supportive measures and pulmonary toilet 2. COVID-19 virus infection in resolution phase. 3. Bilateral hydropneumothorax  last chest x-ray that was done revealed a complex left hydropneumothorax which has been noted on the CT patient is probably going to require a catheter placement also drainage of a empyema 4. Chronic atrial fibrillation rate is controlled at this time 5. Pneumonia due to COVID-19 with residual changes along with probable empyema 6. Healthcare associated pneumonia treated we will continue with supportive care   I have personally seen and evaluated the patient, evaluated laboratory and imaging results, formulated the assessment and plan and placed orders. The Patient requires high complexity decision making with multiple systems involvement.  Rounds were done with the Respiratory Therapy Director and Staff therapists and discussed with nursing staff also.  Allyne Gee, MD [facility] Pulmonary Critical Care Medicine Sleep Medicine

## 2019-05-19 NOTE — Procedures (Signed)
Interventional Radiology Procedure Note  Procedure: Placement of a left posterior pleural drain, 81F. To pleura-vac.  Complications: None Recommendations:  - follow up culture - Do not submerge - Routine care  - depending on the culture result, consult to CT surgery may be indicated  Signed,  Yvone Neu. Loreta Ave, DO

## 2019-05-20 ENCOUNTER — Other Ambulatory Visit (HOSPITAL_COMMUNITY): Payer: Self-pay

## 2019-05-20 DIAGNOSIS — I482 Chronic atrial fibrillation, unspecified: Secondary | ICD-10-CM | POA: Diagnosis not present

## 2019-05-20 DIAGNOSIS — J939 Pneumothorax, unspecified: Secondary | ICD-10-CM | POA: Diagnosis not present

## 2019-05-20 DIAGNOSIS — U071 COVID-19: Secondary | ICD-10-CM | POA: Diagnosis not present

## 2019-05-20 DIAGNOSIS — J9621 Acute and chronic respiratory failure with hypoxia: Secondary | ICD-10-CM | POA: Diagnosis not present

## 2019-05-20 LAB — CBC
HCT: 41.3 % (ref 39.0–52.0)
Hemoglobin: 13.2 g/dL (ref 13.0–17.0)
MCH: 27.5 pg (ref 26.0–34.0)
MCHC: 32 g/dL (ref 30.0–36.0)
MCV: 86 fL (ref 80.0–100.0)
Platelets: 596 10*3/uL — ABNORMAL HIGH (ref 150–400)
RBC: 4.8 MIL/uL (ref 4.22–5.81)
RDW: 19.1 % — ABNORMAL HIGH (ref 11.5–15.5)
WBC: 34.7 10*3/uL — ABNORMAL HIGH (ref 4.0–10.5)
nRBC: 0 % (ref 0.0–0.2)

## 2019-05-20 LAB — BASIC METABOLIC PANEL
Anion gap: 13 (ref 5–15)
BUN: 32 mg/dL — ABNORMAL HIGH (ref 6–20)
CO2: 32 mmol/L (ref 22–32)
Calcium: 9.2 mg/dL (ref 8.9–10.3)
Chloride: 94 mmol/L — ABNORMAL LOW (ref 98–111)
Creatinine, Ser: 0.47 mg/dL — ABNORMAL LOW (ref 0.61–1.24)
GFR calc Af Amer: >60 mL/min (ref >60)
GFR calc non Af Amer: >60 mL/min (ref >60)
Glucose, Bld: 126 mg/dL — ABNORMAL HIGH (ref 70–99)
Potassium: 3.3 mmol/L — ABNORMAL LOW (ref 3.5–5.1)
Sodium: 139 mmol/L (ref 135–145)

## 2019-05-20 NOTE — Progress Notes (Signed)
Pulmonary Critical Care Medicine [place]   PULMONARY CRITICAL CARE SERVICE  PROGRESS NOTE  Date of Service: 05/20/2019  Casey Wheeler  WEX:[id number]  DOB: [birth year]   DOA: 05/17/2019  Referring Physician: Merton Border, MD  HPI: Casey Wheeler is a 59 y.o. male seen for follow up of Acute on Chronic Respiratory Failure.  At this time patient is resting comfortably without distress has been on capping trials currently is on 4 L of oxygen using the Oxymizer.  Medications: Reviewed on Rounds  Physical Exam:  Vitals: Temperature is 96.7 pulse 70 respiratory rate 15 blood pressure is 116/59 saturations 98%  Ventilator Settings capping off the ventilator at this time on 4 L of O26  . General: Comfortable at this time . Eyes: Grossly normal lids, irises & conjunctiva . ENT: grossly tongue is normal . Neck: no obvious mass . Cardiovascular: S1 S2 normal no gallop . Respiratory: No rhonchi no rales are noted .  Marland Kitchen Abdomen: soft . Skin: no rash seen on limited exam . Musculoskeletal: not rigid . Psychiatric:unable to assess . Neurologic: no seizure no involuntary movements         Lab Data:   Basic Metabolic Panel: Recent Labs  Lab 05/17/19 0319 05/17/19 0623 05/19/19 1048 05/20/19 1318  NA 137 139 139 139  K 3.6 4.0 3.5 3.3*  CL 93* 98 100 94*  CO2 31 27 29  32  GLUCOSE 152* 117* 97 126*  BUN 37* 39* 33* 32*  CREATININE 0.60* 0.54* 0.46* 0.47*  CALCIUM 9.2 8.8* 9.1 9.2  MG 1.8  --   --   --   PHOS 2.2*  --   --   --     ABG: No results for input(s): PHART, PCO2ART, PO2ART, HCO3, O2SAT in the last 168 hours.  Liver Function Tests: Recent Labs  Lab 05/17/19 0319  AST 24  ALT 26  ALKPHOS 65  BILITOT 0.9  PROT 6.2*  ALBUMIN 3.2*   No results for input(s): LIPASE, AMYLASE in the last 168 hours. No results for input(s): AMMONIA in the last 168 hours.  CBC: Recent Labs  Lab 05/17/19 0319 05/17/19 0623 05/19/19 1048 05/20/19 1318  WBC  36.7* 35.9* 31.9* 34.7*  NEUTROABS  --  34.5*  --   --   HGB 12.9* 12.0* 11.4* 13.2  HCT 41.3 39.9 36.5* 41.3  MCV 85.9 89.1 87.1 86.0  PLT 722* 571* 620* 596*    Cardiac Enzymes: Recent Labs  Lab 05/17/19 0319  CKTOTAL 17*  CKMB 2.5    BNP (last 3 results) Recent Labs    05/17/19 0321  BNP 194.5*    ProBNP (last 3 results) No results for input(s): PROBNP in the last 8760 hours.  Radiological Exams: DG Chest Port 1 View  Result Date: 05/20/2019 CLINICAL DATA:  Left hydropneumothorax. Acute on chronic respiratory failure with hypoxia. EXAM: PORTABLE CHEST 1 VIEW COMPARISON:  Chest x-ray dated 05/18/2019 and CT images dated 05/18/2018 FINDINGS: The heart size and pulmonary vascularity are normal in the right lung is clear. A drainage catheter has now been inserted in the posteromedial aspect of the left hydropneumothorax. There is persistent atelectasis in the left lower lobe. No appreciable residual air in pleural space on the left. Slight elevation of the left hemidiaphragm. NG tube in the stomach. IMPRESSION: Interval placement of a drainage catheter in the posteromedial aspect of the left hydropneumothorax. Persistent atelectasis in the left lower lobe. 1. Electronically Signed   By: Lorriane Shire M.D.  On: 05/20/2019 12:09   DG CHEST PORT 1 VIEW  Result Date: 05/18/2019 CLINICAL DATA:  Acute on chronic respiratory failure. EXAM: PORTABLE CHEST 1 VIEW COMPARISON:  Chest x-ray dated 05/17/2019 and chest CT dated 05/08/2019 FINDINGS: The heart size and pulmonary vascularity are normal in the right lung is clear. Improved aeration in the left upper lobe with decreased left hydropneumothorax, primarily loculated posterior medially. Likely compressive atelectasis in the left lower lobe. NG tube tip is below the diaphragm. No acute bone abnormality. Tracheostomy tube in place. Coronary artery stent or dense coronary artery calcification is noted. IMPRESSION: Improved aeration in the  left upper lobe. Slightly decreased left hydropneumothorax. Probable adjacent atelectasis in the left lower lobe. Electronically Signed   By: Francene Boyers M.D.   On: 05/18/2019 16:47   CT IMAGE GUIDED FLUID DRAIN BY CATHETER  Result Date: 05/19/2019 INDICATION: 59 year old male with loculated left pleural fluid possible empyema EXAM: CT-GUIDED DRAINAGE OF LEFT PLEURAL FLUID MEDICATIONS: The patient is currently admitted to the hospital and receiving intravenous antibiotics. The antibiotics were administered within an appropriate time frame prior to the initiation of the procedure. ANESTHESIA/SEDATION: 0 mg IV Versed 50 mcg IV Fentanyl Moderate Sedation Time:  None The patient was continuously monitored during the procedure by the interventional radiology nurse under my direct supervision. COMPLICATIONS: None TECHNIQUE: Informed written consent was obtained from the patient after a thorough discussion of the procedural risks, benefits and alternatives. All questions were addressed. Maximal Sterile Barrier Technique was utilized including caps, mask, sterile gowns, sterile gloves, sterile drape, hand hygiene and skin antiseptic. A timeout was performed prior to the initiation of the procedure. PROCEDURE: The operative field was prepped with chlorhexidine in a sterile fashion, and a sterile drape was applied covering the operative field. A sterile gown and sterile gloves were used for the procedure. Local anesthesia was provided with 1% Lidocaine. Patient was in the left decubitus position when scout CT was performed. 1% lidocaine was used for local anesthesia. Using modified Seldinger technique, 10 French drain was placed into the fluid and gas collection of the left posterior pleural space. Once the drain was in place approximately 10 cc of murky brown fluid aspirated for a sample. Drain was left in place attached to a pleura vac. Sutured in position. Patient tolerated the procedure well and remained  hemodynamically stable throughout. No complications were encountered and no significant blood loss. FINDINGS: Fluid and gas collection persists of the left posterior pleural space, potentially small slightly smaller than the comparison CT. Proximally 10 cc of murky brown fluid was sent for culture IMPRESSION: Status post left pleural drain placement for presumed empyema. Signed, Yvone Neu. Reyne Dumas, RPVI Vascular and Interventional Radiology Specialists [facility] Radiology Electronically Signed   By: Gilmer Mor D.O.   On: 05/19/2019 10:30    Assessment/Plan Active Problems:   Acute on chronic respiratory failure with hypoxia (HCC)   Chronic atrial fibrillation (HCC)   COVID-19 virus infection   Pneumonia due to COVID-19 virus   Bilateral pneumothorax   Healthcare-associated pneumonia   1. Acute on chronic respiratory failure hypoxia plan is to continue with capping as well.  Patient is on 4 L of change with regular catheter. 2. Chronic atrial fibrillation rate is controlled 3. COVID-19 virus infection treated resolved 4. Pneumonia due to COVID-19 improving 5. Bilateral pneumothorax status post chest tube placement 6. Healthcare associated pneumonia treated we'll continue with supportive care   I have personally seen and evaluated the patient, evaluated laboratory  and imaging results, formulated the assessment and plan and placed orders. The Patient requires high complexity decision making with multiple systems involvement.  Rounds were done with the Respiratory Therapy Director and Staff therapists and discussed with nursing staff also.  Allyne Gee, MD [facility] Pulmonary Critical Care Medicine Sleep Medicine

## 2019-05-20 NOTE — Progress Notes (Signed)
Referring Physician(s): Dr Laren Everts  Supervising Physician: Corrie Mckusick  Patient Status:  Select IP  Chief Complaint:  Left hydropneumothorax  Subjective:  05/19/19:Procedure: Placement of a left posterior pleural drain, 80F. To pleura-vac. 150 cc output in vac serous color No air leak Pt is up in bed and much more alert today   Allergies: Patient has no known allergies.  Medications: Prior to Admission medications   Not on File     Vital Signs: BP 96/66   Pulse 75   Resp 15   SpO2 97%   Physical Exam Vitals reviewed.  Skin:    General: Skin is warm and dry.     Comments: Site is clean and dry NT no bleeding No air leak  pleurvac  intact 150 cc serous fluid collected     Imaging: DG CHEST PORT 1 VIEW  Result Date: 05/18/2019 CLINICAL DATA:  Acute on chronic respiratory failure. EXAM: PORTABLE CHEST 1 VIEW COMPARISON:  Chest x-ray dated 05/17/2019 and chest CT dated 05/08/2019 FINDINGS: The heart size and pulmonary vascularity are normal in the right lung is clear. Improved aeration in the left upper lobe with decreased left hydropneumothorax, primarily loculated posterior medially. Likely compressive atelectasis in the left lower lobe. NG tube tip is below the diaphragm. No acute bone abnormality. Tracheostomy tube in place. Coronary artery stent or dense coronary artery calcification is noted. IMPRESSION: Improved aeration in the left upper lobe. Slightly decreased left hydropneumothorax. Probable adjacent atelectasis in the left lower lobe. Electronically Signed   By: Lorriane Shire M.D.   On: 05/18/2019 16:47   DG Chest Port 1 View  Result Date: 05/17/2019 CLINICAL DATA:  STEMI EXAM: PORTABLE CHEST 1 VIEW COMPARISON:  CT chest 05/08/2019 FINDINGS: Redemonstrated elevation of left hemidiaphragm with a complex left hydropneumothorax and adjacent passive atelectasis in the remaining aerated portions of the left lung. Overall appearance is similar to  comparison CT accounting for differences in technique. Right lung is clear. No right effusion or pneumothorax. Left heart borders are largely obscured by overlying opacity. Calcified aorta is noted. Tracheostomy tube terminates in the mid trachea, transesophageal tube tip and side port distal to the GE junction terminating in the left upper quadrant. Long segment thoracolumbar fusion hardware is again noted. Additional support devices and telemetry leads overlie the chest. No acute osseous or soft tissue abnormality. Subacute to chronic deformities of the right seventh and eighth rib are again noted. IMPRESSION: Tracheostomy and transesophageal tubes are in satisfactory position. Elevation of left hemidiaphragm with complex left hydropneumothorax, similar to comparison CT. Opacity in the left lung is likely combination of passive atelectasis and layering pleural fluid. Right lung is clear. Cardiac borders are largely obscured. Subacute to chronic deformities of the right ribs, stable from prior. Electronically Signed   By: Lovena Le M.D.   On: 05/17/2019 03:39   CT IMAGE GUIDED FLUID DRAIN BY CATHETER  Result Date: 05/19/2019 INDICATION: 59 year old male with loculated left pleural fluid possible empyema EXAM: CT-GUIDED DRAINAGE OF LEFT PLEURAL FLUID MEDICATIONS: The patient is currently admitted to the hospital and receiving intravenous antibiotics. The antibiotics were administered within an appropriate time frame prior to the initiation of the procedure. ANESTHESIA/SEDATION: 0 mg IV Versed 50 mcg IV Fentanyl Moderate Sedation Time:  None The patient was continuously monitored during the procedure by the interventional radiology nurse under my direct supervision. COMPLICATIONS: None TECHNIQUE: Informed written consent was obtained from the patient after a thorough discussion of the procedural risks, benefits and  alternatives. All questions were addressed. Maximal Sterile Barrier Technique was utilized  including caps, mask, sterile gowns, sterile gloves, sterile drape, hand hygiene and skin antiseptic. A timeout was performed prior to the initiation of the procedure. PROCEDURE: The operative field was prepped with chlorhexidine in a sterile fashion, and a sterile drape was applied covering the operative field. A sterile gown and sterile gloves were used for the procedure. Local anesthesia was provided with 1% Lidocaine. Patient was in the left decubitus position when scout CT was performed. 1% lidocaine was used for local anesthesia. Using modified Seldinger technique, 10 French drain was placed into the fluid and gas collection of the left posterior pleural space. Once the drain was in place approximately 10 cc of murky brown fluid aspirated for a sample. Drain was left in place attached to a pleura vac. Sutured in position. Patient tolerated the procedure well and remained hemodynamically stable throughout. No complications were encountered and no significant blood loss. FINDINGS: Fluid and gas collection persists of the left posterior pleural space, potentially small slightly smaller than the comparison CT. Proximally 10 cc of murky brown fluid was sent for culture IMPRESSION: Status post left pleural drain placement for presumed empyema. Signed, Yvone Neu. Reyne Dumas, RPVI Vascular and Interventional Radiology Specialists [facility] Radiology Electronically Signed   By: Gilmer Mor D.O.   On: 05/19/2019 10:30    Labs:  CBC: Recent Labs    05/11/19 0639 05/17/19 0319 05/17/19 0623 05/19/19 1048  WBC 10.9* 36.7* 35.9* 31.9*  HGB 9.3* 12.9* 12.0* 11.4*  HCT 30.4* 41.3 39.9 36.5*  PLT 616* 722* 571* 620*    COAGS: Recent Labs    05/18/19 0709  INR 1.1    BMP: Recent Labs    05/11/19 0639 05/17/19 0319 05/17/19 0623 05/19/19 1048  NA 136 137 139 139  K 4.4 3.6 4.0 3.5  CL 98 93* 98 100  CO2 26 31 27 29   GLUCOSE 118* 152* 117* 97  BUN 33* 37* 39* 33*  CALCIUM 9.0 9.2 8.8* 9.1    CREATININE 0.42* 0.60* 0.54* 0.46*  GFRNONAA >60 >60 >60 >60  GFRAA >60 >60 >60 >60    LIVER FUNCTION TESTS: Recent Labs    05/09/19 0805 05/17/19 0319  BILITOT  --  0.9  AST  --  24  ALT  --  26  ALKPHOS  --  65  PROT  --  6.2*  ALBUMIN 2.8* 3.2*    Assessment and Plan:  Left Hydroptx Chest tube placed in IR 2/24 Will follow Check CXR  Electronically Signed: 3/24, PA-C 05/20/2019, 10:49 AM   I spent a total of 15 Minutes at the the patient's bedside AND on the patient's hospital floor or unit, greater than 50% of which was counseling/coordinating care for Left CT placement    Patient ID: 05/22/2019, male   DOB: Mar 16, 1961, 59 y.o.   MRN: [id number]

## 2019-05-21 ENCOUNTER — Other Ambulatory Visit (HOSPITAL_COMMUNITY): Payer: Self-pay

## 2019-05-21 DIAGNOSIS — J9621 Acute and chronic respiratory failure with hypoxia: Secondary | ICD-10-CM | POA: Diagnosis not present

## 2019-05-21 DIAGNOSIS — I482 Chronic atrial fibrillation, unspecified: Secondary | ICD-10-CM | POA: Diagnosis not present

## 2019-05-21 DIAGNOSIS — U071 COVID-19: Secondary | ICD-10-CM | POA: Diagnosis not present

## 2019-05-21 DIAGNOSIS — J939 Pneumothorax, unspecified: Secondary | ICD-10-CM | POA: Diagnosis not present

## 2019-05-21 LAB — VANCOMYCIN, TROUGH: Vancomycin Tr: 20 ug/mL (ref 15–20)

## 2019-05-21 NOTE — Progress Notes (Signed)
Referring Physician(s): Hijazi,A  Supervising Physician: Corrie Mckusick  Patient Status:  [facility] IP  Chief Complaint: Left hydropneumothrax   Subjective: Pt currently without new c/o; getting a bath   Allergies: Patient has no known allergies.  Medications: Prior to Admission medications   Not on File     Vital Signs: BP 96/66   Pulse 75   Resp 15   SpO2 97%   Physical Exam awake/alert; left chest tube intact, insertion site ok, not tender,  no air leak, small amount pleural fluid in pleuravac  Imaging: DG CHEST PORT 1 VIEW  Result Date: 05/21/2019 CLINICAL DATA:  Hydropneumothorax, chest tube in place EXAM: PORTABLE CHEST 1 VIEW COMPARISON:  Radiograph 05/20/2019 FINDINGS: Endotracheal tube terminating in the left upper quadrant. Gastric lumen partially out lined by enteric contrast media. Telemetry leads project over the chest. Extensive thoracolumbar fusion hardware is again noted. Pleural drains project over the left chest. Opacities in both lungs are similar to prior. Residual left pleural effusion. Previously seen left pleural gas is not well visualized on this exam. No right effusion or pneumothorax. No acute osseous or soft tissue abnormality. IMPRESSION: Stable appearance of the chest with bilateral pulmonary opacities and left pleural effusion. No visible residual left pleural gas. Pleural drainage catheter projects over the chest. Electronically Signed   By: Lovena Le M.D.   On: 05/21/2019 06:49   DG Chest Port 1 View  Result Date: 05/20/2019 CLINICAL DATA:  Left hydropneumothorax. Acute on chronic respiratory failure with hypoxia. EXAM: PORTABLE CHEST 1 VIEW COMPARISON:  Chest x-ray dated 05/18/2019 and CT images dated 05/18/2018 FINDINGS: The heart size and pulmonary vascularity are normal in the right lung is clear. A drainage catheter has now been inserted in the posteromedial aspect of the left hydropneumothorax. There is persistent atelectasis in the left  lower lobe. No appreciable residual air in pleural space on the left. Slight elevation of the left hemidiaphragm. NG tube in the stomach. IMPRESSION: Interval placement of a drainage catheter in the posteromedial aspect of the left hydropneumothorax. Persistent atelectasis in the left lower lobe. 1. Electronically Signed   By: Lorriane Shire M.D.   On: 05/20/2019 12:09   DG CHEST PORT 1 VIEW  Result Date: 05/18/2019 CLINICAL DATA:  Acute on chronic respiratory failure. EXAM: PORTABLE CHEST 1 VIEW COMPARISON:  Chest x-ray dated 05/17/2019 and chest CT dated 05/08/2019 FINDINGS: The heart size and pulmonary vascularity are normal in the right lung is clear. Improved aeration in the left upper lobe with decreased left hydropneumothorax, primarily loculated posterior medially. Likely compressive atelectasis in the left lower lobe. NG tube tip is below the diaphragm. No acute bone abnormality. Tracheostomy tube in place. Coronary artery stent or dense coronary artery calcification is noted. IMPRESSION: Improved aeration in the left upper lobe. Slightly decreased left hydropneumothorax. Probable adjacent atelectasis in the left lower lobe. Electronically Signed   By: Lorriane Shire M.D.   On: 05/18/2019 16:47   CT IMAGE GUIDED FLUID DRAIN BY CATHETER  Result Date: 05/19/2019 INDICATION: 59 year old male with loculated left pleural fluid possible empyema EXAM: CT-GUIDED DRAINAGE OF LEFT PLEURAL FLUID MEDICATIONS: The patient is currently admitted to the hospital and receiving intravenous antibiotics. The antibiotics were administered within an appropriate time frame prior to the initiation of the procedure. ANESTHESIA/SEDATION: 0 mg IV Versed 50 mcg IV Fentanyl Moderate Sedation Time:  None The patient was continuously monitored during the procedure by the interventional radiology nurse under my direct supervision. COMPLICATIONS: None TECHNIQUE:  Informed written consent was obtained from the patient after a  thorough discussion of the procedural risks, benefits and alternatives. All questions were addressed. Maximal Sterile Barrier Technique was utilized including caps, mask, sterile gowns, sterile gloves, sterile drape, hand hygiene and skin antiseptic. A timeout was performed prior to the initiation of the procedure. PROCEDURE: The operative field was prepped with chlorhexidine in a sterile fashion, and a sterile drape was applied covering the operative field. A sterile gown and sterile gloves were used for the procedure. Local anesthesia was provided with 1% Lidocaine. Patient was in the left decubitus position when scout CT was performed. 1% lidocaine was used for local anesthesia. Using modified Seldinger technique, 10 French drain was placed into the fluid and gas collection of the left posterior pleural space. Once the drain was in place approximately 10 cc of murky brown fluid aspirated for a sample. Drain was left in place attached to a pleura vac. Sutured in position. Patient tolerated the procedure well and remained hemodynamically stable throughout. No complications were encountered and no significant blood loss. FINDINGS: Fluid and gas collection persists of the left posterior pleural space, potentially small slightly smaller than the comparison CT. Proximally 10 cc of murky brown fluid was sent for culture IMPRESSION: Status post left pleural drain placement for presumed empyema. Signed, Yvone Neu. Reyne Dumas, RPVI [facility] Radiology Electronically Signed   By: Gilmer Mor D.O.   On: 05/19/2019 10:30    Labs:  CBC: Recent Labs    05/17/19 0319 05/17/19 0623 05/19/19 1048 05/20/19 1318  WBC 36.7* 35.9* 31.9* 34.7*  HGB 12.9* 12.0* 11.4* 13.2  HCT 41.3 39.9 36.5* 41.3  PLT 722* 571* 620* 596*    COAGS: Recent Labs    05/18/19 0709  INR 1.1    BMP: Recent Labs    05/17/19 0319 05/17/19 0623 05/19/19 1048 05/20/19 1318  NA 137  139 139 139  K 3.6 4.0 3.5 3.3*  CL 93* 98 100 94*  CO2 31 27 29  32  GLUCOSE 152* 117* 97 126*  BUN 37* 39* 33* 32*  CALCIUM 9.2 8.8* 9.1 9.2  CREATININE 0.60* 0.54* 0.46* 0.47*  GFRNONAA >60 >60 >60 >60  GFRAA >60 >60 >60 >60    LIVER FUNCTION TESTS: Recent Labs    05/09/19 0805 05/17/19 0319  BILITOT  --  0.9  AST  --  24  ALT  --  26  ALKPHOS  --  65  PROT  --  6.2*  ALBUMIN 2.8* 3.2*    Assessment and Plan: Patient with history of loculated left pleural effusion/hydropneumothorax, status post chest tube placement on 2/24; no new labs, pleural fluid cultures pending; chest x-ray today with stable appearance/bilateral pulmonary opacities and left pleural effusion, no residual left pleural gas; continue current treatment for now, monitor chest x-ray and pleural fluid cultures.   Electronically Signed: D. 3/24, PA-C 05/21/2019, 11:23 AM   I spent a total of 15 minutes at the the patient's bedside AND on the patient's hospital floor or unit, greater than 50% of which was counseling/coordinating care for left chest tube    Patient ID: 05/23/2019, male   DOB: 09/23/1960, 59 y.o.   MRN: [id number]

## 2019-05-21 NOTE — Progress Notes (Addendum)
Pulmonary Critical Care Medicine [place]   PULMONARY CRITICAL CARE SERVICE  PROGRESS NOTE  Date of Service: 05/21/2019  Casey Wheeler  NLG:[id number]  DOB: September 01, 1960   DOA: 05/17/2019  Referring Physician: Merton Border, MD  HPI: Casey Wheeler is a 59 y.o. male seen for follow up of Acute on Chronic Respiratory Failure.  Patient decannulated today remains on 4 L nasal cannula satting well no distress.  Medications: Reviewed on Rounds  Physical Exam:  Vitals: Pulse 79 respirations 30 BP 122/83 O2 sat 95% temp 97.3  Ventilator Settings 4 L nasal cannula  . General: Comfortable at this time . Eyes: Grossly normal lids, irises & conjunctiva . ENT: grossly tongue is normal . Neck: no obvious mass . Cardiovascular: S1 S2 normal no gallop . Respiratory: No rales or rhonchi noted . Abdomen: soft . Skin: no rash seen on limited exam . Musculoskeletal: not rigid . Psychiatric:unable to assess . Neurologic: no seizure no involuntary movements         Lab Data:   Basic Metabolic Panel: Recent Labs  Lab 05/17/19 0319 05/17/19 0623 05/19/19 1048 05/20/19 1318  NA 137 139 139 139  K 3.6 4.0 3.5 3.3*  CL 93* 98 100 94*  CO2 31 27 29  32  GLUCOSE 152* 117* 97 126*  BUN 37* 39* 33* 32*  CREATININE 0.60* 0.54* 0.46* 0.47*  CALCIUM 9.2 8.8* 9.1 9.2  MG 1.8  --   --   --   PHOS 2.2*  --   --   --     ABG: No results for input(s): PHART, PCO2ART, PO2ART, HCO3, O2SAT in the last 168 hours.  Liver Function Tests: Recent Labs  Lab 05/17/19 0319  AST 24  ALT 26  ALKPHOS 65  BILITOT 0.9  PROT 6.2*  ALBUMIN 3.2*   No results for input(s): LIPASE, AMYLASE in the last 168 hours. No results for input(s): AMMONIA in the last 168 hours.  CBC: Recent Labs  Lab 05/17/19 0319 05/17/19 0623 05/19/19 1048 05/20/19 1318  WBC 36.7* 35.9* 31.9* 34.7*  NEUTROABS  --  34.5*  --   --   HGB 12.9* 12.0* 11.4* 13.2  HCT 41.3 39.9 36.5* 41.3  MCV 85.9 89.1 87.1  86.0  PLT 722* 571* 620* 596*    Cardiac Enzymes: Recent Labs  Lab 05/17/19 0319  CKTOTAL 17*  CKMB 2.5    BNP (last 3 results) Recent Labs    05/17/19 0321  BNP 194.5*    ProBNP (last 3 results) No results for input(s): PROBNP in the last 8760 hours.  Radiological Exams: DG CHEST PORT 1 VIEW  Result Date: 05/21/2019 CLINICAL DATA:  Hydropneumothorax, chest tube in place EXAM: PORTABLE CHEST 1 VIEW COMPARISON:  Radiograph 05/20/2019 FINDINGS: Endotracheal tube terminating in the left upper quadrant. Gastric lumen partially out lined by enteric contrast media. Telemetry leads project over the chest. Extensive thoracolumbar fusion hardware is again noted. Pleural drains project over the left chest. Opacities in both lungs are similar to prior. Residual left pleural effusion. Previously seen left pleural gas is not well visualized on this exam. No right effusion or pneumothorax. No acute osseous or soft tissue abnormality. IMPRESSION: Stable appearance of the chest with bilateral pulmonary opacities and left pleural effusion. No visible residual left pleural gas. Pleural drainage catheter projects over the chest. Electronically Signed   By: Lovena Le M.D.   On: 05/21/2019 06:49   DG Chest Port 1 View  Result Date: 05/20/2019 CLINICAL DATA:  Left hydropneumothorax. Acute on chronic respiratory failure with hypoxia. EXAM: PORTABLE CHEST 1 VIEW COMPARISON:  Chest x-ray dated 05/18/2019 and CT images dated 05/18/2018 FINDINGS: The heart size and pulmonary vascularity are normal in the right lung is clear. A drainage catheter has now been inserted in the posteromedial aspect of the left hydropneumothorax. There is persistent atelectasis in the left lower lobe. No appreciable residual air in pleural space on the left. Slight elevation of the left hemidiaphragm. NG tube in the stomach. IMPRESSION: Interval placement of a drainage catheter in the posteromedial aspect of the left  hydropneumothorax. Persistent atelectasis in the left lower lobe. 1. Electronically Signed   By: Francene Boyers M.D.   On: 05/20/2019 12:09    Assessment/Plan Active Problems:   Acute on chronic respiratory failure with hypoxia (HCC)   Chronic atrial fibrillation (HCC)   COVID-19 virus infection   Pneumonia due to COVID-19 virus   Bilateral pneumothorax   Healthcare-associated pneumonia   1. Acute on chronic respiratory failure hypoxia patient was decannulated today remains on 4 L nasal cannula at this time we will continue supportive measures pulmonary toilet. 2. Chronic atrial fibrillation rate is controlled 3. COVID-19 virus infection treated resolved 4. Pneumonia due to COVID-19 improving 5. Bilateral pneumothorax status post chest tube placement 6. Healthcare associated pneumonia treated we'll continue with supportive care   I have personally seen and evaluated the patient, evaluated laboratory and imaging results, formulated the assessment and plan and placed orders. The Patient requires high complexity decision making with multiple systems involvement.  Rounds were done with the Respiratory Therapy Director and Staff therapists and discussed with nursing staff also.  Yevonne Pax, MD [facility] Pulmonary Critical Care Medicine Sleep Medicine

## 2019-05-22 ENCOUNTER — Other Ambulatory Visit (HOSPITAL_COMMUNITY): Payer: Self-pay

## 2019-05-22 DIAGNOSIS — U071 COVID-19: Secondary | ICD-10-CM | POA: Diagnosis not present

## 2019-05-22 DIAGNOSIS — J9621 Acute and chronic respiratory failure with hypoxia: Secondary | ICD-10-CM | POA: Diagnosis not present

## 2019-05-22 DIAGNOSIS — J939 Pneumothorax, unspecified: Secondary | ICD-10-CM | POA: Diagnosis not present

## 2019-05-22 DIAGNOSIS — I482 Chronic atrial fibrillation, unspecified: Secondary | ICD-10-CM | POA: Diagnosis not present

## 2019-05-22 NOTE — Progress Notes (Addendum)
Pulmonary Critical Care Medicine [place]   PULMONARY CRITICAL CARE SERVICE  PROGRESS NOTE  Date of Service: 05/22/2019  Casey Wheeler  OYD:[id number]  DOB: [birth year]   DOA: 05/17/2019  Referring Physician: Merton Border, MD  HPI: Casey Wheeler is a 59 y.o. male seen for follow up of Acute on Chronic Respiratory Failure.  Patient started on 4 L nasal cannula satting well no fever or distress.  Medications: Reviewed on Rounds  Physical Exam:  Vitals: Pulse 111 respirations 18 BP 124/79 O2 sat 96% temp 96.6  Ventilator Settings 4 L nasal cannula  . General: Comfortable at this time . Eyes: Grossly normal lids, irises & conjunctiva . ENT: grossly tongue is normal . Neck: no obvious mass . Cardiovascular: S1 S2 normal no gallop . Respiratory: No rales or rhonchi noted . Abdomen: soft . Skin: no rash seen on limited exam . Musculoskeletal: not rigid . Psychiatric:unable to assess . Neurologic: no seizure no involuntary movements         Lab Data:   Basic Metabolic Panel: Recent Labs  Lab 05/17/19 0319 05/17/19 0623 05/19/19 1048 05/20/19 1318  NA 137 139 139 139  K 3.6 4.0 3.5 3.3*  CL 93* 98 100 94*  CO2 31 27 29  32  GLUCOSE 152* 117* 97 126*  BUN 37* 39* 33* 32*  CREATININE 0.60* 0.54* 0.46* 0.47*  CALCIUM 9.2 8.8* 9.1 9.2  MG 1.8  --   --   --   PHOS 2.2*  --   --   --     ABG: No results for input(s): PHART, PCO2ART, PO2ART, HCO3, O2SAT in the last 168 hours.  Liver Function Tests: Recent Labs  Lab 05/17/19 0319  AST 24  ALT 26  ALKPHOS 65  BILITOT 0.9  PROT 6.2*  ALBUMIN 3.2*   No results for input(s): LIPASE, AMYLASE in the last 168 hours. No results for input(s): AMMONIA in the last 168 hours.  CBC: Recent Labs  Lab 05/17/19 0319 05/17/19 0623 05/19/19 1048 05/20/19 1318  WBC 36.7* 35.9* 31.9* 34.7*  NEUTROABS  --  34.5*  --   --   HGB 12.9* 12.0* 11.4* 13.2  HCT 41.3 39.9 36.5* 41.3  MCV 85.9 89.1 87.1 86.0  PLT  722* 571* 620* 596*    Cardiac Enzymes: Recent Labs  Lab 05/17/19 0319  CKTOTAL 17*  CKMB 2.5    BNP (last 3 results) Recent Labs    05/17/19 0321  BNP 194.5*    ProBNP (last 3 results) No results for input(s): PROBNP in the last 8760 hours.  Radiological Exams: DG Chest Port 1 View  Result Date: 05/22/2019 CLINICAL DATA:  Left pleural effusion. EXAM: PORTABLE CHEST 1 VIEW COMPARISON:  05/21/2019 FINDINGS: 0624 hours. The cardio pericardial silhouette is enlarged. Asymmetric elevation left hemidiaphragm. NG tube is been removed in the interval. Tracheostomy tube seen previously is not visualized today. Posterior left pleural drain again identified. No evidence for left-sided pneumothorax interval decrease in left pleural effusion. Right lung is clear. Interstitial markings are diffusely coarsened with chronic features. The visualized bony structures of the thorax are intact. Extensive thoracolumbar fusion hardware evident. IMPRESSION: 1. Left pleural drain remains in place with decrease in left pleural effusion. No evidence for pneumothorax 2. Interval NG tube removal with tracheostomy tube no longer visible. Electronically Signed   By: Misty Stanley M.D.   On: 05/22/2019 07:50   DG CHEST PORT 1 VIEW  Result Date: 05/21/2019 CLINICAL DATA:  Hydropneumothorax, chest  tube in place EXAM: PORTABLE CHEST 1 VIEW COMPARISON:  Radiograph 05/20/2019 FINDINGS: Endotracheal tube terminating in the left upper quadrant. Gastric lumen partially out lined by enteric contrast media. Telemetry leads project over the chest. Extensive thoracolumbar fusion hardware is again noted. Pleural drains project over the left chest. Opacities in both lungs are similar to prior. Residual left pleural effusion. Previously seen left pleural gas is not well visualized on this exam. No right effusion or pneumothorax. No acute osseous or soft tissue abnormality. IMPRESSION: Stable appearance of the chest with bilateral  pulmonary opacities and left pleural effusion. No visible residual left pleural gas. Pleural drainage catheter projects over the chest. Electronically Signed   By: Kreg Shropshire M.D.   On: 05/21/2019 06:49    Assessment/Plan Active Problems:   Acute on chronic respiratory failure with hypoxia (HCC)   Chronic atrial fibrillation (HCC)   COVID-19 virus infection   Pneumonia due to COVID-19 virus   Bilateral pneumothorax   Healthcare-associated pneumonia   1. Acute on chronic respiratory failure hypoxia patient remains doing well since decannulation will continue to wean oxygen as tolerated continue supportive measures and pulmonary toilet. 2. Chronic atrial fibrillation rate is controlled 3. COVID-19 virus infection treated resolved 4. Pneumonia due to COVID-19 improving 5. Bilateral pneumothorax status post chest tube placement 6. Healthcare associated pneumonia treated we'll continue with supportive care   I have personally seen and evaluated the patient, evaluated laboratory and imaging results, formulated the assessment and plan and placed orders. The Patient requires high complexity decision making with multiple systems involvement.  Rounds were done with the Respiratory Therapy Director and Staff therapists and discussed with nursing staff also.  Yevonne Pax, MD [facility] Pulmonary Critical Care Medicine Sleep Medicine

## 2019-05-23 DIAGNOSIS — I482 Chronic atrial fibrillation, unspecified: Secondary | ICD-10-CM | POA: Diagnosis not present

## 2019-05-23 DIAGNOSIS — U071 COVID-19: Secondary | ICD-10-CM | POA: Diagnosis not present

## 2019-05-23 DIAGNOSIS — J939 Pneumothorax, unspecified: Secondary | ICD-10-CM | POA: Diagnosis not present

## 2019-05-23 DIAGNOSIS — J9621 Acute and chronic respiratory failure with hypoxia: Secondary | ICD-10-CM | POA: Diagnosis not present

## 2019-05-23 LAB — BASIC METABOLIC PANEL
Anion gap: 11 (ref 5–15)
Anion gap: 12 (ref 5–15)
BUN: 28 mg/dL — ABNORMAL HIGH (ref 6–20)
BUN: 28 mg/dL — ABNORMAL HIGH (ref 6–20)
CO2: 27 mmol/L (ref 22–32)
CO2: 31 mmol/L (ref 22–32)
Calcium: 8.5 mg/dL — ABNORMAL LOW (ref 8.9–10.3)
Calcium: 8.8 mg/dL — ABNORMAL LOW (ref 8.9–10.3)
Chloride: 96 mmol/L — ABNORMAL LOW (ref 98–111)
Chloride: 97 mmol/L — ABNORMAL LOW (ref 98–111)
Creatinine, Ser: 0.42 mg/dL — ABNORMAL LOW (ref 0.61–1.24)
Creatinine, Ser: 0.45 mg/dL — ABNORMAL LOW (ref 0.61–1.24)
GFR calc Af Amer: >60 mL/min (ref >60)
GFR calc Af Amer: >60 mL/min (ref >60)
GFR calc non Af Amer: >60 mL/min (ref >60)
GFR calc non Af Amer: >60 mL/min (ref >60)
Glucose, Bld: 128 mg/dL — ABNORMAL HIGH (ref 70–99)
Glucose, Bld: 194 mg/dL — ABNORMAL HIGH (ref 70–99)
Potassium: 3.1 mmol/L — ABNORMAL LOW (ref 3.5–5.1)
Potassium: 3.1 mmol/L — ABNORMAL LOW (ref 3.5–5.1)
Sodium: 136 mmol/L (ref 135–145)
Sodium: 138 mmol/L (ref 135–145)

## 2019-05-23 LAB — CBC
HCT: 33.6 % — ABNORMAL LOW (ref 39.0–52.0)
Hemoglobin: 10.6 g/dL — ABNORMAL LOW (ref 13.0–17.0)
MCH: 27.4 pg (ref 26.0–34.0)
MCHC: 31.5 g/dL (ref 30.0–36.0)
MCV: 86.8 fL (ref 80.0–100.0)
Platelets: 456 10*3/uL — ABNORMAL HIGH (ref 150–400)
RBC: 3.87 MIL/uL — ABNORMAL LOW (ref 4.22–5.81)
RDW: 19.1 % — ABNORMAL HIGH (ref 11.5–15.5)
WBC: 19.6 10*3/uL — ABNORMAL HIGH (ref 4.0–10.5)
nRBC: 0 % (ref 0.0–0.2)

## 2019-05-23 LAB — PHOSPHORUS: Phosphorus: 1.6 mg/dL — ABNORMAL LOW (ref 2.5–4.6)

## 2019-05-23 LAB — TROPONIN I (HIGH SENSITIVITY): Troponin I (High Sensitivity): 59 ng/L — ABNORMAL HIGH (ref <18)

## 2019-05-23 LAB — VANCOMYCIN, TROUGH
Vancomycin Tr: 14 ug/mL — ABNORMAL LOW (ref 15–20)
Vancomycin Tr: 21 ug/mL (ref 15–20)

## 2019-05-23 LAB — MAGNESIUM: Magnesium: 1.6 mg/dL — ABNORMAL LOW (ref 1.7–2.4)

## 2019-05-23 NOTE — Progress Notes (Signed)
Pulmonary Critical Care Medicine [place]   PULMONARY CRITICAL CARE SERVICE  PROGRESS NOTE  Date of Service: 05/23/2019  Casey Wheeler  PPI:[id number]  DOB: 10-21-1960   DOA: 05/17/2019  Referring Physician: Merton Border, MD  HPI: Casey Wheeler is a 59 y.o. male seen for follow up of Acute on Chronic Respiratory Failure.  Patient is on nasal cannula doing relatively well no major issues are noted.  Patient has had no fevers noted overnight.  This morning was having some issues with tachycardia noted primary care team is on top of that  Medications: Reviewed on Rounds  Physical Exam:  Vitals: Temperature is 97.2 pulse 130 respiratory rate 18 blood pressure is 100/64  Ventilator Settings on nasal cannula we will continue to monitor  . General: Comfortable at this time . Eyes: Grossly normal lids, irises & conjunctiva . ENT: grossly tongue is normal . Neck: no obvious mass . Cardiovascular: S1 S2 normal no gallop . Respiratory: No rhonchi coarse breath sounds . Abdomen: soft . Skin: no rash seen on limited exam . Musculoskeletal: not rigid . Psychiatric:unable to assess . Neurologic: no seizure no involuntary movements         Lab Data:   Basic Metabolic Panel: Recent Labs  Lab 05/17/19 0319 05/17/19 0623 05/19/19 1048 05/20/19 1318 05/23/19 0007  NA 137 139 139 139 136  K 3.6 4.0 3.5 3.3* 3.1*  CL 93* 98 100 94* 97*  CO2 31 27 29  32 27  GLUCOSE 152* 117* 97 126* 194*  BUN 37* 39* 33* 32* 28*  CREATININE 0.60* 0.54* 0.46* 0.47* 0.42*  CALCIUM 9.2 8.8* 9.1 9.2 8.8*  MG 1.8  --   --   --   --   PHOS 2.2*  --   --   --   --     ABG: No results for input(s): PHART, PCO2ART, PO2ART, HCO3, O2SAT in the last 168 hours.  Liver Function Tests: Recent Labs  Lab 05/17/19 0319  AST 24  ALT 26  ALKPHOS 65  BILITOT 0.9  PROT 6.2*  ALBUMIN 3.2*   No results for input(s): LIPASE, AMYLASE in the last 168 hours. No results for input(s): AMMONIA in  the last 168 hours.  CBC: Recent Labs  Lab 05/17/19 0319 05/17/19 0623 05/19/19 1048 05/20/19 1318 05/23/19 0007  WBC 36.7* 35.9* 31.9* 34.7* 19.6*  NEUTROABS  --  34.5*  --   --   --   HGB 12.9* 12.0* 11.4* 13.2 10.6*  HCT 41.3 39.9 36.5* 41.3 33.6*  MCV 85.9 89.1 87.1 86.0 86.8  PLT 722* 571* 620* 596* 456*    Cardiac Enzymes: Recent Labs  Lab 05/17/19 0319  CKTOTAL 17*  CKMB 2.5    BNP (last 3 results) Recent Labs    05/17/19 0321  BNP 194.5*    ProBNP (last 3 results) No results for input(s): PROBNP in the last 8760 hours.  Radiological Exams: DG Chest Port 1 View  Result Date: 05/22/2019 CLINICAL DATA:  Left pleural effusion. EXAM: PORTABLE CHEST 1 VIEW COMPARISON:  05/21/2019 FINDINGS: 0624 hours. The cardio pericardial silhouette is enlarged. Asymmetric elevation left hemidiaphragm. NG tube is been removed in the interval. Tracheostomy tube seen previously is not visualized today. Posterior left pleural drain again identified. No evidence for left-sided pneumothorax interval decrease in left pleural effusion. Right lung is clear. Interstitial markings are diffusely coarsened with chronic features. The visualized bony structures of the thorax are intact. Extensive thoracolumbar fusion hardware evident. IMPRESSION: 1.  Left pleural drain remains in place with decrease in left pleural effusion. No evidence for pneumothorax 2. Interval NG tube removal with tracheostomy tube no longer visible. Electronically Signed   By: Kennith Center M.D.   On: 05/22/2019 07:50    Assessment/Plan Active Problems:   Acute on chronic respiratory failure with hypoxia (HCC)   Chronic atrial fibrillation (HCC)   COVID-19 virus infection   Pneumonia due to COVID-19 virus   Bilateral pneumothorax   Healthcare-associated pneumonia   1. Acute on chronic respiratory failure with hypoxia we will continue with oxygen therapy as necessary. 2. Chronic atrial fibrillation this morning patient  had some issues with tachycardia noted.  May need some adjustment of medications.  Primary care team is on top of that 3. COVID-19 virus infection in resolution phase 4. Pneumonia due to COVID-19 treated 5. Bilateral pneumothorax status post chest tube placement 6. Healthcare associated pneumonia treated clinically with improving   I have personally seen and evaluated the patient, evaluated laboratory and imaging results, formulated the assessment and plan and placed orders. The Patient requires high complexity decision making with multiple systems involvement.  Rounds were done with the Respiratory Therapy Director and Staff therapists and discussed with nursing staff also.  Yevonne Pax, MD [facility] Pulmonary Critical Care Medicine Sleep Medicine

## 2019-05-24 ENCOUNTER — Other Ambulatory Visit (HOSPITAL_COMMUNITY): Payer: Self-pay

## 2019-05-24 LAB — AEROBIC/ANAEROBIC CULTURE W GRAM STAIN (SURGICAL/DEEP WOUND)

## 2019-05-24 LAB — TROPONIN I (HIGH SENSITIVITY): Troponin I (High Sensitivity): 48 ng/L — ABNORMAL HIGH (ref <18)

## 2019-05-24 LAB — MAGNESIUM: Magnesium: 2 mg/dL (ref 1.7–2.4)

## 2019-05-24 LAB — POTASSIUM: Potassium: 3.4 mmol/L — ABNORMAL LOW (ref 3.5–5.1)

## 2019-05-24 MED ORDER — IOHEXOL 300 MG/ML  SOLN
75.0000 mL | Freq: Once | INTRAMUSCULAR | Status: AC | PRN
  Administered 2019-05-24: 75 mL via INTRAVENOUS

## 2019-05-24 NOTE — Progress Notes (Signed)
Patient ID: Casey Wheeler, male   DOB: [birth year], 59 y.o.   MRN: [id number]   05/19/19:Procedure: Placement of aleft posterior pleural drain, 7F. To pleura-vac.  Last rounded on by IR PA 2/26 CT was intact  Pleurvac working well and No air leak   2/27 CXR IMPRESSION: 1. Left pleural drain remains in place with decrease in left pleural effusion. No evidence for pneumothorax 2. Interval NG tube removal with tracheostomy tube no longer Visible.  RN calls now and says CT is "out" No OP of course Questioning if need replacement   Rec: CT Chest w CX to evaluate hydropneumothorax Will check CT and make recommendations accordingly

## 2019-05-25 ENCOUNTER — Institutional Professional Consult (permissible substitution) (HOSPITAL_COMMUNITY): Payer: Self-pay

## 2019-05-25 ENCOUNTER — Other Ambulatory Visit (HOSPITAL_COMMUNITY): Payer: Self-pay

## 2019-05-25 LAB — VANCOMYCIN, TROUGH: Vancomycin Tr: <4 ug/mL — ABNORMAL LOW (ref 15–20)

## 2019-05-25 LAB — POTASSIUM: Potassium: 4 mmol/L (ref 3.5–5.1)

## 2019-05-25 MED ORDER — FENTANYL CITRATE (PF) 100 MCG/2ML IJ SOLN
INTRAMUSCULAR | Status: AC | PRN
  Administered 2019-05-25: 25 ug via INTRAVENOUS

## 2019-05-25 MED ORDER — MIDAZOLAM HCL 2 MG/2ML IJ SOLN
INTRAMUSCULAR | Status: AC | PRN
  Administered 2019-05-25: 0.5 mg via INTRAVENOUS

## 2019-05-25 MED ORDER — FENTANYL CITRATE (PF) 100 MCG/2ML IJ SOLN
INTRAMUSCULAR | Status: AC
  Filled 2019-05-25: qty 2

## 2019-05-25 MED ORDER — MIDAZOLAM HCL 2 MG/2ML IJ SOLN
INTRAMUSCULAR | Status: AC
  Filled 2019-05-25: qty 2

## 2019-05-25 NOTE — Progress Notes (Signed)
Patient ID: Casey Wheeler, male   DOB: 06-22-1960, 59 y.o.   MRN: [id number]  Left hydropneumothorax Left chest tube drain placed in IR 2/24  Dislodged 3/1  CT yesterday: IMPRESSION: Minimally increased size of a small left pleural fluid collection compared to February 13 and 24, 2021. Similar trace right pleural fluid. Aortic Atherosclerosis (ICD10-I70.0) and Emphysema (ICD10-J43.9). Four-vessel coronary calcification, severe along the LAD and left main coronary arteries. Pulmonary artery hypertension. Similar moderate centrilobular emphysema with mild bilateral bronchitis and compressive atelectasis in both lungs. Chronic skeletal postoperative, sequela of remote posttraumatic, and degenerative changes.   Dr Loreta Ave recommending replacement  I have seen pt and he is agreeable to proceed with replacement Consent signed and in chart

## 2019-05-25 NOTE — Procedures (Signed)
Interventional Radiology Procedure Note  Procedure: Placement of a left pleural drain for empyema. 18F. Marland Kitchen  Complications: None Recommendations:  - to pleuravac  Signed,  Yvone Neu. Loreta Ave, DO

## 2019-05-26 LAB — CBC
HCT: 33.5 % — ABNORMAL LOW (ref 39.0–52.0)
Hemoglobin: 10.2 g/dL — ABNORMAL LOW (ref 13.0–17.0)
MCH: 27.4 pg (ref 26.0–34.0)
MCHC: 30.4 g/dL (ref 30.0–36.0)
MCV: 90.1 fL (ref 80.0–100.0)
Platelets: 395 10*3/uL (ref 150–400)
RBC: 3.72 MIL/uL — ABNORMAL LOW (ref 4.22–5.81)
RDW: 19.9 % — ABNORMAL HIGH (ref 11.5–15.5)
WBC: 14.8 10*3/uL — ABNORMAL HIGH (ref 4.0–10.5)
nRBC: 0 % (ref 0.0–0.2)

## 2019-05-26 LAB — BASIC METABOLIC PANEL
Anion gap: 11 (ref 5–15)
BUN: 32 mg/dL — ABNORMAL HIGH (ref 6–20)
CO2: 32 mmol/L (ref 22–32)
Calcium: 8.7 mg/dL — ABNORMAL LOW (ref 8.9–10.3)
Chloride: 94 mmol/L — ABNORMAL LOW (ref 98–111)
Creatinine, Ser: 0.5 mg/dL — ABNORMAL LOW (ref 0.61–1.24)
GFR calc Af Amer: >60 mL/min (ref >60)
GFR calc non Af Amer: >60 mL/min (ref >60)
Glucose, Bld: 132 mg/dL — ABNORMAL HIGH (ref 70–99)
Potassium: 4 mmol/L (ref 3.5–5.1)
Sodium: 137 mmol/L (ref 135–145)

## 2019-05-26 MED ORDER — SODIUM CHLORIDE (PF) 0.9 % IJ SOLN: 5.0000 mg | Freq: Once | INTRAMUSCULAR | Status: DC

## 2019-05-26 NOTE — Progress Notes (Signed)
Reconnected to suction after 1 hr tPA dwell.  Patient tolerated well.  Thick, bloody sludge in tubing, but slow to drain.  IR following.   Loyce Dys, MS RD PA-C

## 2019-05-26 NOTE — Progress Notes (Signed)
Referring Physician(s): Dr. Sharyon Medicus  Supervising Physician: Malachy Moan  Patient Status:  [facility] - In-pt  Chief Complaint: Empyema  Subjective: Dense, thick, bloody fluid aspirated during procedure yesterday.  Chest tube with little output overnight.  Tubing appears clotted.   Allergies: Patient has no known allergies.  Medications: Prior to Admission medications   Not on File     Vital Signs: BP 101/64   Pulse (!) 103   Resp 17   SpO2 100%   Physical Exam  NAD, alert Chest: L chest drain in place.  Thick, bloody output in tubing.  Appears clotted.  Does have equal breath sounds bilaterally.   Imaging: CT HEAD WO CONTRAST  Result Date: 05/25/2019 CLINICAL DATA:  Headache.  Acute on chronic respiratory failure. EXAM: CT HEAD WITHOUT CONTRAST TECHNIQUE: Contiguous axial images were obtained from the base of the skull through the vertex without intravenous contrast. COMPARISON:  None. FINDINGS: Brain: No evidence of acute infarction, hemorrhage, hydrocephalus, extra-axial collection or mass lesion/mass effect. Cortical atrophy noted. Vascular: No hyperdense vessel or unexpected calcification. Skull: Intact.  No focal lesion. Sinuses/Orbits: Postoperative change right orbit noted. Other: None. IMPRESSION: No acute abnormality. Postoperative change right orbit. Electronically Signed   By: Drusilla Kanner M.D.   On: 05/25/2019 12:13   CT CHEST W CONTRAST  Result Date: 05/24/2019 CLINICAL DATA:  Displaced chest tube. Evaluation for residual pleural fluid. Pleurisy, chest pain and dyspnea. EXAM: CT CHEST WITH CONTRAST TECHNIQUE: Multidetector CT imaging of the chest was performed during intravenous contrast administration. Automatic exposure control utilized. CONTRAST:  32mL OMNIPAQUE IOHEXOL 300 MG/ML  SOLN COMPARISON:  May 08, 2019. CT image-guided fluid drained by catheter dated May 19, 2019. FINDINGS: Cardiovascular: Mild cardiomegaly. Coarse mitral annulus  calcification. Severe left main and LAD coronary calcification. Mild left circumflex and right main coronary calcification. Mild aortic valvular calcification and thoracic aorta calcified atherosclerosis without aneurysmal dilatation. 2.9 cm pulmonary trunk diameter, consistent with pulmonary artery hypertension. Moderate calcified atherosclerosis of the left carotid bulb and origin of the left vertebral artery. Mediastinum/Nodes: No adenopathy. Remote tracheostomy tract with interval removal of the tracheostomy tube. Interval tracheostomy removal. Lungs/Pleura: Enhancing atelectasis in the lung bases and dependent lingula. Similar moderate centrilobular emphysema. Mild bilateral nonspecific bronchitis. No pneumothorax or pulmonary edema. Trace residual dependent right pleural fluid, similar to February 13 and 24, 2021. Small residual left pleural fluid collection, it is Hounsfield unit measurement degraded by adjacent spinal stabilization hardware artifact, measuring 6 x 3 x 9 cm in greatest dimension anteroposteriorly by transversely by craniocaudal length, minimally increased in size without residual air density. Upper Abdomen: Incidental finding of an 8 mm left adrenal nodule, likely a benign adenoma based on size criteria; no additional imaging is recommended given size. 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 Chronic moderate T8 and T10 30% loss of vertebral body heights along the inferior endplates. Grade 1 retrolisthesis of C6 on C7, probably degenerative. Benign sternal foramen. Moderate skeletal degenerative change. IMPRESSION: Minimally increased size of a small left pleural fluid collection compared to February 13 and 24, 2021. Similar trace right pleural fluid. Aortic Atherosclerosis (ICD10-I70.0) and Emphysema (ICD10-J43.9). Four-vessel coronary calcification,  severe along the LAD and left main coronary arteries. Pulmonary artery hypertension. Similar moderate centrilobular emphysema with mild bilateral bronchitis and compressive atelectasis in both lungs. Chronic skeletal postoperative, sequela of remote posttraumatic, and degenerative changes. Electronically Signed   By: Fleet Contras  [place]   On: 05/24/2019 15:55   CT IMAGE GUIDED DRAINAGE BY PERCUTANEOUS CATHETER  Result Date: 05/26/2019 INDICATION: 59 year old male with a history left empyema, previous drain placement with positive cultures. Drain has become displaced and he has been referred for new drain placement EXAM: CT-GUIDED CHEST TUBE MEDICATIONS: The patient is currently admitted to the [facility] and receiving intravenous antibiotics. The antibiotics were administered within an appropriate time frame prior to the initiation of the procedure. ANESTHESIA/SEDATION: 0.5 mg IV Versed 25 mcg IV Fentanyl Moderate Sedation Time:  30 minutes The patient was continuously monitored during the procedure by the interventional radiology nurse under my direct supervision. COMPLICATIONS: None TECHNIQUE: Informed written consent was obtained from the patient after a thorough discussion of the procedural risks, benefits and alternatives. All questions were addressed. Maximal Sterile Barrier Technique was utilized including caps, mask, sterile gowns, sterile gloves, sterile drape, hand hygiene and skin antiseptic. A timeout was performed prior to the initiation of the procedure. PROCEDURE: The operative field was prepped with chlorhexidine in a sterile fashion, and a sterile drape was applied covering the operative field. A sterile gown and sterile gloves were used for the procedure. Local anesthesia was provided with 1% Lidocaine. Patient positioned left decubitus position. Patient is prepped and draped in the usual sterile fashion. 1% lidocaine was used for local anesthesia. Using CT guidance and modified Seldinger technique, 10  French drain was placed into the complex fluid collection of the left pleural space. Drain was attached to pleura vac. Sutured in position. Sterile dressing was placed. Final image was stored. Patient tolerated the procedure well and remained hemodynamically stable throughout. No complications were encountered and no significant blood loss. FINDINGS: Complex fluid collection of the pleural space again demonstrated in the left posteromedial pleural space. Ten [place] drain into the collection, attached to pleural vac. IMPRESSION: Status post CT-guided drainage of left empyema. Signed, Dulcy Fanny. Dellia Nims, RPVI [facility] Radiology Electronically Signed   By: Corrie Mckusick D.O.   On: 05/26/2019 08:59    Labs:  CBC: Recent Labs    05/19/19 1048 05/20/19 1318 05/23/19 0007 05/26/19 0919  WBC 31.9* 34.7* 19.6* 14.8*  HGB 11.4* 13.2 10.6* 10.2*  HCT 36.5* 41.3 33.6* 33.5*  PLT 620* 596* 456* 395    COAGS: Recent Labs    05/18/19 0709  INR 1.1    BMP: Recent Labs    05/20/19 1318 05/20/19 1318 05/23/19 0007 05/23/19 0007 05/23/19 2201 05/24/19 0400 05/25/19 0548 05/26/19 0919  NA 139  --  136  --  138  --   --  137  K 3.3*   < > 3.1*   < > 3.1* 3.4* 4.0 4.0  CL 94*  --  97*  --  96*  --   --  94*  CO2 32  --  27  --  31  --   --  32  GLUCOSE 126*  --  194*  --  128*  --   --  132*  BUN 32*  --  28*  --  28*  --   --  32*  CALCIUM 9.2  --  8.8*  --  8.5*  --   --  8.7*  CREATININE 0.47*  --  0.42*  --  0.45*  --   --  0.50*  GFRNONAA >60  --  >60  --  >60  --   --  >60  GFRAA >60  --  >60  --  >  60  --   --  >60   < > = values in this interval not displayed.    LIVER FUNCTION TESTS: Recent Labs    05/09/19 0805 05/17/19 0319  BILITOT  --  0.9  AST  --  24  ALT  --  26  ALKPHOS  --  65  PROT  --  6.2*  ALBUMIN 2.8* 3.2*    Assessment and Plan: Empyema Patient with left chest drain in place.  Thick, bloody fluid  aspirated yesterday.  Tubing now appears clotted with little output from Pleurvac.  Discussed with Dr. Loreta Ave. Attempt tPA today.  Order placed for 5mg /25 mL sterile saline.   Dr. , RN aware.  Electronically Signed: Sharyon Medicus, PA 05/26/2019, 1:51 PM   I spent a total of 15 Minutes at the the patient's bedside AND on the patient's [facility] floor or unit, greater than 50% of which was counseling/coordinating care for empyema

## 2019-05-26 NOTE — Progress Notes (Signed)
tPA instilled at bedside at 1611.  4mg /31mL.  Also stripped tubing.  RN flushed with good clearance.  Chest tube clamped.   Will connect back to suction after 1 hr.   30m, MS RD PA-C 4:28 PM

## 2019-05-27 ENCOUNTER — Other Ambulatory Visit (HOSPITAL_COMMUNITY): Payer: Self-pay

## 2019-05-27 LAB — VANCOMYCIN, TROUGH: Vancomycin Tr: 17 ug/mL (ref 15–20)

## 2019-05-27 NOTE — Progress Notes (Signed)
Referring Physician(s): Hijazi  Supervising Physician: Gilmer Mor  Patient Status:  [facility]  Chief Complaint:  Left hydropneumothorax New Chest tube placed 3/2  Subjective:  Left Chest tube originally placed 2/24 in IR Came out 3/1 New placed 3/2 in IR [place] OP 3/3-- tpa dwell 3/3 pm Today with 110 c in pleurvac - bloody  CXR today: No significant change in left lower lobe opacities representing a combination of airspace disease and pleural fluid.  Pt with no complaints Wants to go home    Allergies: Patient has no known allergies.  Medications: Prior to Admission medications   Not on File     Vital Signs: BP 101/64   Pulse (!) 103   Resp 17   SpO2 100%   Physical Exam Vitals reviewed.  Skin:    General: Skin is warm and dry.     Comments: Site of CT is clean and dry NT OP in vac is 110 cc Bloody OP No air leak  Neurological:     Mental Status: He is alert.     Imaging: CT HEAD WO CONTRAST  Result Date: 05/25/2019 CLINICAL DATA:  Headache.  Acute on chronic respiratory failure. EXAM: CT HEAD WITHOUT CONTRAST TECHNIQUE: Contiguous axial images were obtained from the base of the skull through the vertex without intravenous contrast. COMPARISON:  None. FINDINGS: Brain: No evidence of acute infarction, hemorrhage, hydrocephalus, extra-axial collection or mass lesion/mass effect. Cortical atrophy noted. Vascular: No hyperdense vessel or unexpected calcification. Skull: Intact.  No focal lesion. Sinuses/Orbits: Postoperative change right orbit noted. Other: None. IMPRESSION: No acute abnormality. Postoperative change right orbit. Electronically Signed   By: Drusilla Kanner M.D.   On: 05/25/2019 12:13   CT CHEST W CONTRAST  Result Date: 05/24/2019 CLINICAL DATA:  Displaced chest tube. Evaluation for residual pleural fluid. Pleurisy, chest pain and dyspnea. EXAM: CT CHEST WITH CONTRAST TECHNIQUE: Multidetector CT imaging of the chest was performed  during intravenous contrast administration. Automatic exposure control utilized. CONTRAST:  62mL OMNIPAQUE IOHEXOL 300 MG/ML  SOLN COMPARISON:  May 08, 2019. CT image-guided fluid drained by catheter dated May 19, 2019. FINDINGS: Cardiovascular: Mild cardiomegaly. Coarse mitral annulus calcification. Severe left main and LAD coronary calcification. Mild left circumflex and right main coronary calcification. Mild aortic valvular calcification and thoracic aorta calcified atherosclerosis without aneurysmal dilatation. 2.9 cm pulmonary trunk diameter, consistent with pulmonary artery hypertension. Moderate calcified atherosclerosis of the left carotid bulb and origin of the left vertebral artery. Mediastinum/Nodes: No adenopathy. Remote tracheostomy tract with interval removal of the tracheostomy tube. Interval tracheostomy removal. Lungs/Pleura: Enhancing atelectasis in the lung bases and dependent lingula. Similar moderate centrilobular emphysema. Mild bilateral nonspecific bronchitis. No pneumothorax or pulmonary edema. Trace residual dependent right pleural fluid, similar to February 13 and 24, 2021. Small residual left pleural fluid collection, it is Hounsfield unit measurement degraded by adjacent spinal stabilization hardware artifact, measuring 6 x 3 x 9 cm in greatest dimension anteroposteriorly by transversely by craniocaudal length, minimally increased in size without residual air density. Upper Abdomen: Incidental finding of an 8 mm left adrenal nodule, likely a benign adenoma based on size criteria; no additional imaging is recommended given size. 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 Chronic moderate T8 and T10 30% loss of vertebral body heights along the inferior endplates. Grade 1 retrolisthesis of C6 on C7, probably degenerative. Benign sternal  foramen. Moderate skeletal degenerative change. IMPRESSION:  Minimally increased size of a small left pleural fluid collection compared to February 13 and 24, 2021. Similar trace right pleural fluid. Aortic Atherosclerosis (ICD10-I70.0) and Emphysema (ICD10-J43.9). Four-vessel coronary calcification, severe along the LAD and left main coronary arteries. Pulmonary artery hypertension. Similar moderate centrilobular emphysema with mild bilateral bronchitis and compressive atelectasis in both lungs. Chronic skeletal postoperative, sequela of remote posttraumatic, and degenerative changes. Electronically Signed   By: Laurence Ferrari   On: 05/24/2019 15:55   DG Chest Port 1 View  Result Date: 05/27/2019 CLINICAL DATA:  Empyema of the pleural space.  Chest tube. EXAM: PORTABLE CHEST 1 VIEW COMPARISON:  CT chest 05/24/2019.  One-view chest x-ray 05/22/2019. FINDINGS: Left-sided chest tube remains in place. Left hemidiaphragm is elevated. Left basilar airspace disease is otherwise unchanged. Mild pulmonary vascular congestion is present. The right lung is clear. Extensive spinal fusion is again noted. IMPRESSION: 1. Left-sided chest tube in place. 2. No significant change in left lower lobe opacities representing a combination of airspace disease and pleural fluid. 3. Elevated left hemidiaphragm. Electronically Signed   By: Marin Roberts M.D.   On: 05/27/2019 06:03   CT IMAGE GUIDED DRAINAGE BY PERCUTANEOUS CATHETER  Result Date: 05/26/2019 INDICATION: 59 year old male with a history left empyema, previous drain placement with positive cultures. Drain has become displaced and he has been referred for new drain placement EXAM: CT-GUIDED CHEST TUBE MEDICATIONS: The patient is currently admitted to the hospital and receiving intravenous antibiotics. The antibiotics were administered within an appropriate time frame prior to the initiation of the procedure. ANESTHESIA/SEDATION: 0.5 mg IV Versed 25 mcg IV Fentanyl  Moderate Sedation Time:  30 minutes The patient was continuously monitored during the procedure by the interventional radiology nurse under my direct supervision. COMPLICATIONS: None TECHNIQUE: Informed written consent was obtained from the patient after a thorough discussion of the procedural risks, benefits and alternatives. All questions were addressed. Maximal Sterile Barrier Technique was utilized including caps, mask, sterile gowns, sterile gloves, sterile drape, hand hygiene and skin antiseptic. A timeout was performed prior to the initiation of the procedure. PROCEDURE: The operative field was prepped with chlorhexidine in a sterile fashion, and a sterile drape was applied covering the operative field. A sterile gown and sterile gloves were used for the procedure. Local anesthesia was provided with 1% Lidocaine. Patient positioned left decubitus position. Patient is prepped and draped in the usual sterile fashion. 1% lidocaine was used for local anesthesia. Using CT guidance and modified Seldinger technique, 10 French drain was placed into the complex fluid collection of the left pleural space. Drain was attached to pleura vac. Sutured in position. Sterile dressing was placed. Final image was stored. Patient tolerated the procedure well and remained hemodynamically stable throughout. No complications were encountered and no significant blood loss. FINDINGS: Complex fluid collection of the pleural space again demonstrated in the left posteromedial pleural space. Ten [place] drain into the collection, attached to pleural vac. IMPRESSION: Status post CT-guided drainage of left empyema. Signed, Yvone Neu. Reyne Dumas, RPVI [facility] Radiology Electronically Signed   By: Gilmer Mor D.O.   On: 05/26/2019 08:59    Labs:  CBC: Recent Labs    05/19/19 1048 05/20/19 1318 05/23/19 0007 05/26/19 0919  WBC 31.9* 34.7* 19.6* 14.8*  HGB 11.4* 13.2 10.6* 10.2*    HCT 36.5* 41.3 33.6* 33.5*  PLT 620* 596* 456* 395    COAGS: Recent Labs    05/18/19 0709  INR 1.1  BMP: Recent Labs    05/20/19 1318 05/20/19 1318 05/23/19 0007 05/23/19 0007 05/23/19 2201 05/24/19 0400 05/25/19 0548 05/26/19 0919  NA 139  --  136  --  138  --   --  137  K 3.3*   < > 3.1*   < > 3.1* 3.4* 4.0 4.0  CL 94*  --  97*  --  96*  --   --  94*  CO2 32  --  27  --  31  --   --  32  GLUCOSE 126*  --  194*  --  128*  --   --  132*  BUN 32*  --  28*  --  28*  --   --  32*  CALCIUM 9.2  --  8.8*  --  8.5*  --   --  8.7*  CREATININE 0.47*  --  0.42*  --  0.45*  --   --  0.50*  GFRNONAA >60  --  >60  --  >60  --   --  >60  GFRAA >60  --  >60  --  >60  --   --  >60   < > = values in this interval not displayed.    LIVER FUNCTION TESTS: Recent Labs    05/09/19 0805 05/17/19 0319  BILITOT  --  0.9  AST  --  24  ALT  --  26  ALKPHOS  --  65  PROT  --  6.2*  ALBUMIN 2.8* 3.2*    Assessment and Plan:  Left hydroptx chest tube drain intact Afeb; CXR stable Wbc trending down Will follow  Electronically Signed: Lavonia Drafts, PA-C 05/27/2019, 11:00 AM   I spent a total of 15 Minutes at the the patient's bedside AND on the patient's hospital floor or unit, greater than 50% of which was counseling/coordinating care for left chest tube drain

## 2019-05-27 NOTE — Progress Notes (Signed)
PROGRESS NOTE    Casey Wheeler  MEQ:[id number] DOB: [birth year] DOA: 05/17/2019    Brief Narrative:  Casey Wheeler an 59 y.o.malewho was initially admitted to [facility] ([place]) on 01/01/2019 with worsening shortness of breath and cough that started 3 days prior to admission. He has multiple medical problems including history of COPD on oxygen, scoliosis, chronic back pain, GERD, alcohol abuse, atrial fibrillation, gout, chronic anemia, hypertension, Wernicke's encephalopathy, paroxysmal atrial fibrillation, hyperlipidemia, GERD, depression. In the ER he was found to have oxygen saturation of 77% on room air. He was found to have COVID-19 infection. During his hospital stay his oxygenation worsened and he was intubated on 03/08/2019. He had a prolonged course on the vent but was eventually extubated on 03/21/2019 but reintubated on 03/22/2019. 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 Patient hospital course complicated by pneumomediastinum and bilateral pneumothorax in the first week of January requiring bilateral chest tubes. He had tracheal aspirate that was growing Pseudomonas. He was initially placed on meropenem and then changed to Maxipime and aminoglycoside. He was then switched to clindamycin, aztreonam per infectious disease on 04/30/2019. Due to his complex medical problems he was transferred here for further care. Here he had leukocytosis, fever. PreviousChest x-ray per report significantly worsened volume loss in the left hemithorax with near complete opacification of the left hemithorax. Likely central left airway mucoid impaction with near complete left lung atelectasis/pneumonia. Hazy right lung opacities. His respiratory cultures from here showed Pseudomonas, light growth MRSA.  He was treated with cefepime and Flagyl.  On 05/25/2019 he had a placement of left pleural drain for empyema.  Culture showing Pseudomonas.  Currently on treatment with ciprofloxacin,  vancomycin.   Assessment & Plan:   Active Problems:   Acute on chronic respiratory failure with hypoxia (HCC)   Chronic atrial fibrillation (HCC)   COVID-19 virus infection   Pneumonia due to COVID-19 virus   Bilateral pneumothorax   Healthcare-associated pneumonia Empyema Pneumonia with Pseudomonas COPD Dysphagia Protein calorie malnutrition History of alcohol abuse Wernicke's encephalopathy Depression/anxiety  Acute on chronic respiratory failure with hypoxia: Likely multifactorial. He has COPD and recently had COVID-19 pneumonia and healthcare associated pneumonia. His respiratory cultures at the outside facility showed Pseudomonas. His respiratory cultures from here showed light growth Pseudomonas aeruginosa, MRSA also light growth.  He completed treatment with with cefepime, Flagyl.  However, he developed empyema. On 05/25/2019 he had a placement of left pleural drain for empyema.  Cultures showing rare Pseudomonas aeruginosa.  Currently on treatment with ciprofloxacin, empiric IV vancomycin as he previously also had MRSA on his respiratory cultures.  His current tentative date to and is 06/02/2019.  However, would recommend to extend to complete a total of 2-week treatment.  COVID-19 infection with pneumonia: He received convalescent plasma at the outside facility. Remdesivir was not given secondary to elevated LFTs. Continue supportive management per primary team.  Healthcare associated pneumonia: Respiratory cultures reportedly showed Pseudomonas at the outside facility.   He was on clindamycin/Azactam. Discontinued both of these because of his worsening respiratory failure and switched to cefepime and Flagyl which he completed.  Subsequently developed empyema with drain placement.  Culture showing Pseudomonas.  Antibiotics and plan as mentioned above.  He is very high risk for recurrent pneumonia.  Leukocytosis: Likely secondary to pneumonia/empyema.   Antibiotics and plan  as mentioned above. Continue to monitor counts.  Bilateral pneumothorax with subsequent empyema: He has chest tube in place.  Paroxysmal atrial fibrillation: Continue medications per primary team.  Protein  calorie malnutrition/dysphagia: Unfortunately due to his dysphagia he is very high risk for aspiration and worsening respiratory failure secondary to aspiration pneumonia.  Management per primary team.  History of alcoholism/Wernicke's: Continue medications per primary team.  Depression/anxiety: Continue medications and management per primary team.  Unfortunately due to his complex medical problems he is very high risk for worsening and decompensation. Plan of care discussed with the primary team.  Subjective: He is complaining of back pain, pain in his hips, complaining of anxiety.  Objective: Vitals:   05/25/19 1655 05/25/19 1700 05/25/19 1705 05/25/19 1720  BP: 94/65 (!) 92/53 93/63 101/64  Pulse: (!) 108 (!) 106 (!) 109 (!) 103  Resp: 16 20 18 17   SpO2: 100% 100% 100%    No intake or output data in the 24 hours ending 05/27/19 1714 There were no vitals filed for this visit.  Examination:  General:Thin, ill-appearing male, awake, on O2 nasal canula Eyes: PERLA, EOMI ENMT: external ears and nose appear normal, Lips appears normal Neck:Has trach in place CVS: S1-S2 clear, no murmur Respiratory:Decreased breath sound lower lobes,  occasional rhonchi,occasionalwheezing, has pigtail catheter Abdomen: soft nontender, nondistended, normal bowel sounds  Musculoskeletal: no edema,no contractures Neuro:He is able to move all extremities, following commands, appears to have debility with generalized weakness Psych:Anxious appearing, stable mental status Skin: no rashes    Data Reviewed: I have personally reviewed following labs and imaging studies  CBC: Recent Labs  Lab 05/23/19 0007 05/26/19 0919  WBC 19.6* 14.8*  HGB 10.6* 10.2*  HCT 33.6* 33.5*  MCV  86.8 90.1  PLT 456* 395   Basic Metabolic Panel: Recent Labs  Lab 05/23/19 0007 05/23/19 2201 05/24/19 0400 05/25/19 0548 05/26/19 0919  NA 136 138  --   --  137  K 3.1* 3.1* 3.4* 4.0 4.0  CL 97* 96*  --   --  94*  CO2 27 31  --   --  32  GLUCOSE 194* 128*  --   --  132*  BUN 28* 28*  --   --  32*  CREATININE 0.42* 0.45*  --   --  0.50*  CALCIUM 8.8* 8.5*  --   --  8.7*  MG  --  1.6* 2.0  --   --   PHOS  --  1.6*  --   --   --    GFR: Estimated Creatinine Clearance: 100.6 mL/min (A) (by C-G formula based on SCr of 0.5 mg/dL (L)). Liver Function Tests: No results for input(s): AST, ALT, ALKPHOS, BILITOT, PROT, ALBUMIN in the last 168 hours. No results for input(s): LIPASE, AMYLASE in the last 168 hours. No results for input(s): AMMONIA in the last 168 hours. Coagulation Profile: No results for input(s): INR, PROTIME in the last 168 hours. Cardiac Enzymes: No results for input(s): CKTOTAL, CKMB, CKMBINDEX, TROPONINI in the last 168 hours. BNP (last 3 results) No results for input(s): PROBNP in the last 8760 hours. HbA1C: No results for input(s): HGBA1C in the last 72 hours. CBG: No results for input(s): GLUCAP in the last 168 hours. Lipid Profile: No results for input(s): CHOL, HDL, LDLCALC, TRIG, CHOLHDL, LDLDIRECT in the last 72 hours. Thyroid Function Tests: No results for input(s): TSH, T4TOTAL, FREET4, T3FREE, THYROIDAB in the last 72 hours. Anemia Panel: No results for input(s): VITAMINB12, FOLATE, FERRITIN, TIBC, IRON, RETICCTPCT in the last 72 hours. Sepsis Labs: No results for input(s): PROCALCITON, LATICACIDVEN in the last 168 hours.  Recent Results (from the past 240 hour(s))  Aerobic/Anaerobic Culture (surgical/deep wound)     Status: None   Collection Time: 05/19/19  9:58 AM   Specimen: Abscess  Result Value Ref Range Status   Specimen Description ABSCESS PLEURAL  Final   Special Requests NONE  Final   Gram Stain   Final    FEW WBC PRESENT,  PREDOMINANTLY PMN NO ORGANISMS SEEN    Culture   Final    RARE PSEUDOMONAS AERUGINOSA NO ANAEROBES ISOLATED Performed at [facility], [place] [address], [place], [place] [address]    Report Status 05/24/2019 FINAL  Final   Organism ID, Bacteria PSEUDOMONAS AERUGINOSA  Final      Susceptibility   Pseudomonas aeruginosa - MIC*    CEFTAZIDIME 4 SENSITIVE Sensitive     CIPROFLOXACIN <=0.25 SENSITIVE Sensitive     GENTAMICIN <=1 SENSITIVE Sensitive     IMIPENEM 2 SENSITIVE Sensitive     CEFEPIME 2 SENSITIVE Sensitive     * RARE PSEUDOMONAS AERUGINOSA         Radiology Studies: DG Chest Port 1 View  Result Date: 05/27/2019 CLINICAL DATA:  Empyema of the pleural space.  Chest tube. EXAM: PORTABLE CHEST 1 VIEW COMPARISON:  CT chest 05/24/2019.  One-view chest x-ray 05/22/2019. FINDINGS: Left-sided chest tube remains in place. Left hemidiaphragm is elevated. Left basilar airspace disease is otherwise unchanged. Mild pulmonary vascular congestion is present. The right lung is clear. Extensive spinal fusion is again noted. IMPRESSION: 1. Left-sided chest tube in place. 2. No significant change in left lower lobe opacities representing a combination of airspace disease and pleural fluid. 3. Elevated left hemidiaphragm. Electronically Signed   By: San Morelle M.D.   On: 05/27/2019 06:03   CT IMAGE GUIDED DRAINAGE BY PERCUTANEOUS CATHETER  Result Date: 05/26/2019 INDICATION: 59 year old male with a history left empyema, previous drain placement with positive cultures. Drain has become displaced and he has been referred for new drain placement EXAM: CT-GUIDED CHEST TUBE MEDICATIONS: The patient is currently admitted to the hospital and receiving intravenous antibiotics. The antibiotics were administered within an appropriate time frame prior to the initiation of the procedure. ANESTHESIA/SEDATION: 0.5 mg IV Versed 25 mcg IV Fentanyl Moderate Sedation Time:  30 minutes The patient was  continuously monitored during the procedure by the interventional radiology nurse under my direct supervision. COMPLICATIONS: None TECHNIQUE: Informed written consent was obtained from the patient after a thorough discussion of the procedural risks, benefits and alternatives. All questions were addressed. Maximal Sterile Barrier Technique was utilized including caps, mask, sterile gowns, sterile gloves, sterile drape, hand hygiene and skin antiseptic. A timeout was performed prior to the initiation of the procedure. PROCEDURE: The operative field was prepped with chlorhexidine in a sterile fashion, and a sterile drape was applied covering the operative field. A sterile gown and sterile gloves were used for the procedure. Local anesthesia was provided with 1% Lidocaine. Patient positioned left decubitus position. Patient is prepped and draped in the usual sterile fashion. 1% lidocaine was used for local anesthesia. Using CT guidance and modified Seldinger technique, 10 French drain was placed into the complex fluid collection of the left pleural space. Drain was attached to pleura vac. Sutured in position. Sterile dressing was placed. Final image was stored. Patient tolerated the procedure well and remained hemodynamically stable throughout. No complications were encountered and no significant blood loss. FINDINGS: Complex fluid collection of the pleural space again demonstrated in the left posteromedial pleural space. Ten [place] drain into the collection, attached to pleural vac.  IMPRESSION: Status post CT-guided drainage of left empyema. Signed, Yvone Neu. Reyne Dumas, RPVI [facility] Electronically Signed   By: Gilmer Mor D.O.   On: 05/26/2019 08:59        Scheduled Meds: . alteplase (TPA) for intrapleural administration  5 mg Intrapleural Once   Please see MAR   Vonzella Nipple, MD  05/27/2019, 5:14 PM

## 2019-05-28 LAB — VANCOMYCIN, TROUGH: Vancomycin Tr: 13 ug/mL — ABNORMAL LOW (ref 15–20)

## 2019-05-28 NOTE — Progress Notes (Signed)
Supervising Physician: Arne Cleveland  Patient Status:  [facility] - In-pt ([facility])   Chief Complaint:  SHOB with left sided empyema  Subjective: History of COPD ( on o2), scolioses, a fib, Etoh  abuse, Wernicke's encephalopathy.  Admitted to OSH for worsening [facility] s/p trach  with bilateral pneumothorax and transferred to [facility] due to complexity of care. Patient found to have a left sided empyema and  IR placed a left sided chest tube on 2.24.21 and replaced it on 3.2.21 after the initial tube was pulled out. On 3.3.21 a TPA dwell was instilled.  170 ml of seriosangious fluid noted in the atrium chamber. Drain site is unremarkable with no erythema, tenderness or drainage noted. Suture and stat lock in place. Dressing is clean dry and intact. Patient alert and sitting  in bed, calm and comfortable. Denies any fevers, headache, chest pain, SOB, cough, abdominal pain, nausea, vomiting or bleeding.    Allergies: Patient has no known allergies.  Medications: Prior to Admission medications   Not on File     Vital Signs: BP 101/64   Pulse (!) 103   Resp 17   SpO2 100%   Physical Exam Vitals and nursing note reviewed.  Constitutional:      Appearance: He is well-developed.  HENT:     Head: Normocephalic.  Pulmonary:     Effort: Pulmonary effort is normal.     Comments: 170 ml of seriosangious fluid noted in the atrium chamber. Drain site is unremarkable with no erythema, tenderness or drainage noted. Suture and stat lock in place. Dressing is clean dry and intact.   Musculoskeletal:        General: Normal range of motion.     Cervical back: Normal range of motion.  Skin:    General: Skin is dry.  Neurological:     Mental Status: He is alert and oriented to person, place, and time.     Imaging: CT HEAD WO CONTRAST  Result Date: 05/25/2019 CLINICAL DATA:  Headache.  Acute on chronic respiratory failure. EXAM: CT HEAD WITHOUT CONTRAST TECHNIQUE: Contiguous axial images were  obtained from the base of the skull through the vertex without intravenous contrast. COMPARISON:  None. FINDINGS: Brain: No evidence of acute infarction, hemorrhage, hydrocephalus, extra-axial collection or mass lesion/mass effect. Cortical atrophy noted. Vascular: No hyperdense vessel or unexpected calcification. Skull: Intact.  No focal lesion. Sinuses/Orbits: Postoperative change right orbit noted. Other: None. IMPRESSION: No acute abnormality. Postoperative change right orbit. Electronically Signed   By: Inge Rise M.D.   On: 05/25/2019 12:13   CT CHEST W CONTRAST  Result Date: 05/24/2019 CLINICAL DATA:  Displaced chest tube. Evaluation for residual pleural fluid. Pleurisy, chest pain and dyspnea. EXAM: CT CHEST WITH CONTRAST TECHNIQUE: Multidetector CT imaging of the chest was performed during intravenous contrast administration. Automatic exposure control utilized. CONTRAST:  39mL OMNIPAQUE IOHEXOL 300 MG/ML  SOLN COMPARISON:  May 08, 2019. CT image-guided fluid drained by catheter dated May 19, 2019. FINDINGS: Cardiovascular: Mild cardiomegaly. Coarse mitral annulus calcification. Severe left main and LAD coronary calcification. Mild left circumflex and right main coronary calcification. Mild aortic valvular calcification and thoracic aorta calcified atherosclerosis without aneurysmal dilatation. 2.9 cm pulmonary trunk diameter, consistent with pulmonary artery hypertension. Moderate calcified atherosclerosis of the left carotid bulb and origin of the left vertebral artery. Mediastinum/Nodes: No adenopathy. Remote tracheostomy tract with interval removal of the tracheostomy tube. Interval tracheostomy removal. Lungs/Pleura: Enhancing atelectasis in the lung bases and dependent lingula. Similar moderate centrilobular  emphysema. Mild bilateral nonspecific bronchitis. No pneumothorax or pulmonary edema. Trace residual dependent right pleural fluid, similar to February 13 and 24, 2021. Small  residual left pleural fluid collection, it is Hounsfield unit measurement degraded by adjacent spinal stabilization hardware artifact, measuring 6 x 3 x 9 cm in greatest dimension anteroposteriorly by transversely by craniocaudal length, minimally increased in size without residual air density. Upper Abdomen: Incidental finding of an 8 mm left adrenal nodule, likely a benign adenoma based on size criteria; no additional imaging is recommended given size. 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 Chronic moderate T8 and T10 30% loss of vertebral body heights along the inferior endplates. Grade 1 retrolisthesis of C6 on C7, probably degenerative. Benign sternal foramen. Moderate skeletal degenerative change. IMPRESSION: Minimally increased size of a small left pleural fluid collection compared to February 13 and 24, 2021. Similar trace right pleural fluid. Aortic Atherosclerosis (ICD10-I70.0) and Emphysema (ICD10-J43.9). Four-vessel coronary calcification, severe along the LAD and left main coronary arteries. Pulmonary artery hypertension. Similar moderate centrilobular emphysema with mild bilateral bronchitis and compressive atelectasis in both lungs. Chronic skeletal postoperative, sequela of remote posttraumatic, and degenerative changes. Electronically Signed   By: Laurence Ferrari   On: 05/24/2019 15:55   DG Chest Port 1 View  Result Date: 05/27/2019 CLINICAL DATA:  Empyema of the pleural space.  Chest tube. EXAM: PORTABLE CHEST 1 VIEW COMPARISON:  CT chest 05/24/2019.  One-view chest x-ray 05/22/2019. FINDINGS: Left-sided chest tube remains in place. Left hemidiaphragm is elevated. Left basilar airspace disease is otherwise unchanged. Mild pulmonary vascular congestion is present. The right lung is clear. Extensive spinal fusion is again noted. IMPRESSION: 1. Left-sided  chest tube in place. 2. No significant change in left lower lobe opacities representing a combination of airspace disease and pleural fluid. 3. Elevated left hemidiaphragm. Electronically Signed   By: Marin Roberts M.D.   On: 05/27/2019 06:03   CT IMAGE GUIDED DRAINAGE BY PERCUTANEOUS CATHETER  Result Date: 05/26/2019 INDICATION: 59 year old male with a history left empyema, previous drain placement with positive cultures. Drain has become displaced and he has been referred for new drain placement EXAM: CT-GUIDED CHEST TUBE MEDICATIONS: The patient is currently admitted to the hospital and receiving intravenous antibiotics. The antibiotics were administered within an appropriate time frame prior to the initiation of the procedure. ANESTHESIA/SEDATION: 0.5 mg IV Versed 25 mcg IV Fentanyl Moderate Sedation Time:  30 minutes The patient was continuously monitored during the procedure by the interventional radiology nurse under my direct supervision. COMPLICATIONS: None TECHNIQUE: Informed written consent was obtained from the patient after a thorough discussion of the procedural risks, benefits and alternatives. All questions were addressed. Maximal Sterile Barrier Technique was utilized including caps, mask, sterile gowns, sterile gloves, sterile drape, hand hygiene and skin antiseptic. A timeout was performed prior to the initiation of the procedure. PROCEDURE: The operative field was prepped with chlorhexidine in a sterile fashion, and a sterile drape was applied covering the operative field. A sterile gown and sterile gloves were used for the procedure. Local anesthesia was provided with 1% Lidocaine. Patient positioned left decubitus position. Patient is prepped and draped in the usual sterile fashion. 1% lidocaine was used for local anesthesia. Using CT guidance and modified Seldinger technique, 10 French drain was placed into the complex fluid collection of the left pleural space. Drain was attached to  pleura vac. Sutured in position. Sterile dressing was  placed. Final image was stored. Patient tolerated the procedure well and remained hemodynamically stable throughout. No complications were encountered and no significant blood loss. FINDINGS: Complex fluid collection of the pleural space again demonstrated in the left posteromedial pleural space. Ten [place] drain into the collection, attached to pleural vac. IMPRESSION: Status post CT-guided drainage of left empyema. Signed, Yvone Neu. Reyne Dumas, RPVI [facility] Electronically Signed   By: Gilmer Mor D.O.   On: 05/26/2019 08:59    Labs:  CBC: Recent Labs    05/19/19 1048 05/20/19 1318 05/23/19 0007 05/26/19 0919  WBC 31.9* 34.7* 19.6* 14.8*  HGB 11.4* 13.2 10.6* 10.2*  HCT 36.5* 41.3 33.6* 33.5*  PLT 620* 596* 456* 395    COAGS: Recent Labs    05/18/19 0709  INR 1.1    BMP: Recent Labs    05/20/19 1318 05/20/19 1318 05/23/19 0007 05/23/19 0007 05/23/19 2201 05/24/19 0400 05/25/19 0548 05/26/19 0919  NA 139  --  136  --  138  --   --  137  K 3.3*   < > 3.1*   < > 3.1* 3.4* 4.0 4.0  CL 94*  --  97*  --  96*  --   --  94*  CO2 32  --  27  --  31  --   --  32  GLUCOSE 126*  --  194*  --  128*  --   --  132*  BUN 32*  --  28*  --  28*  --   --  32*  CALCIUM 9.2  --  8.8*  --  8.5*  --   --  8.7*  CREATININE 0.47*  --  0.42*  --  0.45*  --   --  0.50*  GFRNONAA >60  --  >60  --  >60  --   --  >60  GFRAA >60  --  >60  --  >60  --   --  >60   < > = values in this interval not displayed.    LIVER FUNCTION TESTS: Recent Labs    05/09/19 0805 05/17/19 0319  BILITOT  --  0.9  AST  --  24  ALT  --  26  ALKPHOS  --  65  PROT  --  6.2*  ALBUMIN 2.8* 3.2*    Assessment and Plan:  59 y.o, male inpatient ([facility]). History of COPD ( on o2), scolioses, a fib, Etoh  abuse, Wernicke's encephalopathy.  Admitted to OSH for worsening [facility] s/p trach  with bilateral  pneumothorax and transferred to [facility] due to complexity of care. Patient found to have a left sided empyema and  IR placed a left sided chest tube on 2.24.21 and replaced it on 3.2.21 after the initial tube was pulled out. On 3.3.21 a TPA dwell was instilled.  170 ml of seriosangious fluid noted in the atrium chamber. 100 ml on 3.4.21. Drain site is unremarkable with no erythema, tenderness or drainage noted. Suture and stat lock in place. Dressing is clean dry and intact.   Pertinent Imaging 3.1.21 - chest xray shows left sided chest tube.   Pertinent IR History 2.24.21 - Placement of left sided chest tube 3.2.21 - Replacement left sided chest tube - pulled out 3.3.21 - TPA dwell  Pertinent Allergies NKDA  Culture positive for pseudomonas.   Per note from Dr. Ardelle Anton dated 3.4.21 Repeat CT would be reasonable in the near future to evaluate the  size of any recurrent fluid.  This may be performed when the output of the drain decreases below 20-30cc per day of output.    Would also consider discussion with CT surgery, given the presence of empyema.      Electronically Signed: Marletta Lor, NP 05/28/2019, 2:31 PM   I spent a total of 15 Minutes at the the patient's bedside AND on the patient's hospital floor or unit, greater than 50% of which was counseling/coordinating care for right sided chest tube

## 2019-05-29 LAB — BASIC METABOLIC PANEL
Anion gap: 11 (ref 5–15)
BUN: 32 mg/dL — ABNORMAL HIGH (ref 6–20)
CO2: 29 mmol/L (ref 22–32)
Calcium: 8.5 mg/dL — ABNORMAL LOW (ref 8.9–10.3)
Chloride: 99 mmol/L (ref 98–111)
Creatinine, Ser: 0.52 mg/dL — ABNORMAL LOW (ref 0.61–1.24)
GFR calc Af Amer: >60 mL/min (ref >60)
GFR calc non Af Amer: >60 mL/min (ref >60)
Glucose, Bld: 149 mg/dL — ABNORMAL HIGH (ref 70–99)
Potassium: 3.2 mmol/L — ABNORMAL LOW (ref 3.5–5.1)
Sodium: 139 mmol/L (ref 135–145)

## 2019-05-29 LAB — CBC
HCT: 30 % — ABNORMAL LOW (ref 39.0–52.0)
Hemoglobin: 9.3 g/dL — ABNORMAL LOW (ref 13.0–17.0)
MCH: 27.6 pg (ref 26.0–34.0)
MCHC: 31 g/dL (ref 30.0–36.0)
MCV: 89 fL (ref 80.0–100.0)
Platelets: 298 10*3/uL (ref 150–400)
RBC: 3.37 MIL/uL — ABNORMAL LOW (ref 4.22–5.81)
RDW: 20.3 % — ABNORMAL HIGH (ref 11.5–15.5)
WBC: 15.3 10*3/uL — ABNORMAL HIGH (ref 4.0–10.5)
nRBC: 0 % (ref 0.0–0.2)

## 2019-05-29 LAB — MAGNESIUM: Magnesium: 1.6 mg/dL — ABNORMAL LOW (ref 1.7–2.4)

## 2019-05-30 LAB — MAGNESIUM: Magnesium: 1.7 mg/dL (ref 1.7–2.4)

## 2019-05-30 LAB — POTASSIUM: Potassium: 4.4 mmol/L (ref 3.5–5.1)

## 2019-05-31 ENCOUNTER — Other Ambulatory Visit (HOSPITAL_COMMUNITY): Payer: Self-pay

## 2019-05-31 NOTE — Progress Notes (Signed)
    Referring Physician(s): Dr Sharyon Medicus  Supervising Physician: Gilmer Mor  Patient Status:  [facility]  Chief Complaint:  Left hydropneumothorax Left chest tube drain in place  Subjective:  IR placed drain 2/24-- replaced 3/2 tpa dwell 3/3 OP improved 400 cc in Vac now (170 cc 3/5)  Up in bed Feeling better daily Wants to go home   Allergies: Patient has no known allergies.  Medications: Prior to Admission medications   Not on File     Vital Signs: BP 101/64   Pulse (!) 103   Resp 17   SpO2 100%   Physical Exam Vitals reviewed.  Pulmonary:     Breath sounds: Normal breath sounds.  Skin:    General: Skin is warm.     Comments: Left chest tube intact OP 400 cc in vac No air leak  CXR pending      Imaging: No results found.  Labs:  CBC: Recent Labs    05/20/19 1318 05/23/19 0007 05/26/19 0919 05/29/19 0540  WBC 34.7* 19.6* 14.8* 15.3*  HGB 13.2 10.6* 10.2* 9.3*  HCT 41.3 33.6* 33.5* 30.0*  PLT 596* 456* 395 298    COAGS: Recent Labs    05/18/19 0709  INR 1.1    BMP: Recent Labs    05/23/19 0007 05/23/19 0007 05/23/19 2201 05/24/19 0400 05/25/19 0548 05/26/19 0919 05/29/19 0540 05/30/19 0637  NA 136  --  138  --   --  137 139  --   K 3.1*   < > 3.1*   < > 4.0 4.0 3.2* 4.4  CL 97*  --  96*  --   --  94* 99  --   CO2 27  --  31  --   --  32 29  --   GLUCOSE 194*  --  128*  --   --  132* 149*  --   BUN 28*  --  28*  --   --  32* 32*  --   CALCIUM 8.8*  --  8.5*  --   --  8.7* 8.5*  --   CREATININE 0.42*  --  0.45*  --   --  0.50* 0.52*  --   GFRNONAA >60  --  >60  --   --  >60 >60  --   GFRAA >60  --  >60  --   --  >60 >60  --    < > = values in this interval not displayed.    LIVER FUNCTION TESTS: Recent Labs    05/09/19 0805 05/17/19 0319  BILITOT  --  0.9  AST  --  24  ALT  --  26  ALKPHOS  --  65  PROT  --  6.2*  ALBUMIN 2.8* 3.2*    Assessment and Plan:  L Hydroptx chest tube drain intact OP good;  no air leak CXR pending  Will review with Rad  Electronically Signed: Robet Leu, PA-C 05/31/2019, 8:43 AM   I spent a total of 15 Minutes at the the patient's bedside AND on the patient's hospital floor or unit, greater than 50% of which was counseling/coordinating care for L CT drain

## 2019-06-01 LAB — VANCOMYCIN, TROUGH: Vancomycin Tr: 20 ug/mL (ref 15–20)

## 2019-06-01 NOTE — Progress Notes (Signed)
Referring Physician(s): Dr Laren Everts  Supervising Physician: Corrie Mckusick  Patient Status:  [facility]  Chief Complaint:  Left hydropneumothorax Chest tube drain placed 05/19/19 Replaced  05/26/19  Subjective:  OP 460 cc in Pleurvac today (400 cc yesterday) Bloody OP +pseudomonas 05/19/19  Pt is up in bed Feels better daily Still with some SOB on exertion  "wants to go home--- or to SNF asap"  Have discussed with Dr Earleen Newport Really would recommend CT surgery evaluation And Re CT when OP less than 20 cc/daily       Allergies: Patient has no known allergies.  Medications: Prior to Admission medications   Not on File     Vital Signs: BP 101/64   Pulse (!) 103   Resp 17   SpO2 100%   Physical Exam Vitals reviewed.  Skin:    General: Skin is warm and dry.     Comments: Site clean and dry CT drain intact OP light bloody fluid 460 cc in vac (400 cc yesterday)  No air leak  Cx + pseudomonas 05/19/19   Neurological:     Mental Status: He is alert.     Imaging: DG Chest Port 1 View  Result Date: 05/31/2019 CLINICAL DATA:  Chest tube in place for recent empyema EXAM: PORTABLE CHEST 1 VIEW COMPARISON:  May 27, 2019 FINDINGS: Chest tube remains on the left with persistent elevation of left hemidiaphragm. No pneumothorax. There is a small left pleural effusion with patchy airspace opacity in the left mid lower lung zones, stable. A well-defined empyema is currently not appreciable. On the right, there is ill-defined airspace opacity in the right mid lung. Right lung otherwise clear. Heart remains borderline enlarged with mild pulmonary venous hypertension. No adenopathy. Extensive postoperative change in the spine noted. There is calcification in the left carotid artery. IMPRESSION: Chest tube remains on the left without pneumothorax. Persistent elevation of left hemidiaphragm. Airspace opacity on the left is stable without well-defined empyema. New ill-defined  opacity in the right mid lung, likely developing pneumonia. Stable cardiac silhouette with mild pulmonary vascular congestion. Electronically Signed   By: Lowella Grip III M.D.   On: 05/31/2019 10:12    Labs:  CBC: Recent Labs    05/20/19 1318 05/23/19 0007 05/26/19 0919 05/29/19 0540  WBC 34.7* 19.6* 14.8* 15.3*  HGB 13.2 10.6* 10.2* 9.3*  HCT 41.3 33.6* 33.5* 30.0*  PLT 596* 456* 395 298    COAGS: Recent Labs    05/18/19 0709  INR 1.1    BMP: Recent Labs    05/23/19 0007 05/23/19 0007 05/23/19 2201 05/24/19 0400 05/25/19 0548 05/26/19 0919 05/29/19 0540 05/30/19 0637  NA 136  --  138  --   --  137 139  --   K 3.1*   < > 3.1*   < > 4.0 4.0 3.2* 4.4  CL 97*  --  96*  --   --  94* 99  --   CO2 27  --  31  --   --  32 29  --   GLUCOSE 194*  --  128*  --   --  132* 149*  --   BUN 28*  --  28*  --   --  32* 32*  --   CALCIUM 8.8*  --  8.5*  --   --  8.7* 8.5*  --   CREATININE 0.42*  --  0.45*  --   --  0.50* 0.52*  --   GFRNONAA >  60  --  >60  --   --  >60 >60  --   GFRAA >60  --  >60  --   --  >60 >60  --    < > = values in this interval not displayed.    LIVER FUNCTION TESTS: Recent Labs    05/09/19 0805 05/17/19 0319  BILITOT  --  0.9  AST  --  24  ALT  --  26  ALKPHOS  --  65  PROT  --  6.2*  ALBUMIN 2.8* 3.2*    Assessment and Plan:  Left hydropneumothorax Left chest tube drain placed 2/24 and replaced 05/26/19 OP bloody 60 cc in 24 hrs Rec: CT surgery eval and re CT when OP less than 20 cc /day   Electronically Signed: Robet Leu, PA-C 06/01/2019, 10:15 AM   I spent a total of 15 Minutes at the the patient's bedside AND on the patient's hospital floor or unit, greater than 50% of which was counseling/coordinating care for left chest drain

## 2019-06-02 LAB — BASIC METABOLIC PANEL
Anion gap: 10 (ref 5–15)
BUN: 30 mg/dL — ABNORMAL HIGH (ref 6–20)
CO2: 34 mmol/L — ABNORMAL HIGH (ref 22–32)
Calcium: 8.6 mg/dL — ABNORMAL LOW (ref 8.9–10.3)
Chloride: 98 mmol/L (ref 98–111)
Creatinine, Ser: 0.46 mg/dL — ABNORMAL LOW (ref 0.61–1.24)
GFR calc Af Amer: >60 mL/min (ref >60)
GFR calc non Af Amer: >60 mL/min (ref >60)
Glucose, Bld: 138 mg/dL — ABNORMAL HIGH (ref 70–99)
Potassium: 2.8 mmol/L — ABNORMAL LOW (ref 3.5–5.1)
Sodium: 142 mmol/L (ref 135–145)

## 2019-06-02 LAB — CBC
HCT: 30.1 % — ABNORMAL LOW (ref 39.0–52.0)
Hemoglobin: 9.6 g/dL — ABNORMAL LOW (ref 13.0–17.0)
MCH: 28.7 pg (ref 26.0–34.0)
MCHC: 31.9 g/dL (ref 30.0–36.0)
MCV: 89.9 fL (ref 80.0–100.0)
Platelets: 139 10*3/uL — ABNORMAL LOW (ref 150–400)
RBC: 3.35 MIL/uL — ABNORMAL LOW (ref 4.22–5.81)
RDW: 21.2 % — ABNORMAL HIGH (ref 11.5–15.5)
WBC: 19.5 10*3/uL — ABNORMAL HIGH (ref 4.0–10.5)
nRBC: 0.1 % (ref 0.0–0.2)

## 2019-06-02 NOTE — Progress Notes (Signed)
Referring Physician(s): Dr. Sharyon Medicus  Supervising Physician: Richarda Overlie  Patient Status:  [facility]   Chief Complaint: Follow up left hydropneumothorax s/p chest tube placed 05/19/19 and replaced 05/26/19 after it was inadvertently removed.  Subjective:  Patient sleeping peacefully on exam. Staff denies any concerns today.   Allergies: Patient has no known allergies.  Medications: Prior to Admission medications   Not on File     Vital Signs: BP 101/64   Pulse (!) 103   Resp 17   SpO2 100%   Physical Exam Vitals reviewed.  Constitutional:      General: He is not in acute distress.    Comments: Sleeping during exam  HENT:     Head: Normocephalic.  Cardiovascular:     Rate and Rhythm: Normal rate.  Pulmonary:     Effort: Pulmonary effort is normal.     Comments: (+) left chest tube to pleurvac with ~510 serosanguineous output. No obvious air leak.  Skin:    General: Skin is warm and dry.     Imaging: DG Chest Port 1 View  Result Date: 05/31/2019 CLINICAL DATA:  Chest tube in place for recent empyema EXAM: PORTABLE CHEST 1 VIEW COMPARISON:  May 27, 2019 FINDINGS: Chest tube remains on the left with persistent elevation of left hemidiaphragm. No pneumothorax. There is a small left pleural effusion with patchy airspace opacity in the left mid lower lung zones, stable. A well-defined empyema is currently not appreciable. On the right, there is ill-defined airspace opacity in the right mid lung. Right lung otherwise clear. Heart remains borderline enlarged with mild pulmonary venous hypertension. No adenopathy. Extensive postoperative change in the spine noted. There is calcification in the left carotid artery. IMPRESSION: Chest tube remains on the left without pneumothorax. Persistent elevation of left hemidiaphragm. Airspace opacity on the left is stable without well-defined empyema. New ill-defined opacity in the right mid lung, likely developing pneumonia.  Stable cardiac silhouette with mild pulmonary vascular congestion. Electronically Signed   By: Bretta Bang III M.D.   On: 05/31/2019 10:12    Labs:  CBC: Recent Labs    05/23/19 0007 05/26/19 0919 05/29/19 0540 06/02/19 0530  WBC 19.6* 14.8* 15.3* 19.5*  HGB 10.6* 10.2* 9.3* 9.6*  HCT 33.6* 33.5* 30.0* 30.1*  PLT 456* 395 298 139*    COAGS: Recent Labs    05/18/19 0709  INR 1.1    BMP: Recent Labs    05/23/19 2201 05/24/19 0400 05/26/19 0919 05/29/19 0540 05/30/19 0637 06/02/19 0530  NA 138  --  137 139  --  142  K 3.1*   < > 4.0 3.2* 4.4 2.8*  CL 96*  --  94* 99  --  98  CO2 31  --  32 29  --  34*  GLUCOSE 128*  --  132* 149*  --  138*  BUN 28*  --  32* 32*  --  30*  CALCIUM 8.5*  --  8.7* 8.5*  --  8.6*  CREATININE 0.45*  --  0.50* 0.52*  --  0.46*  GFRNONAA >60  --  >60 >60  --  >60  GFRAA >60  --  >60 >60  --  >60   < > = values in this interval not displayed.    LIVER FUNCTION TESTS: Recent Labs    05/09/19 0805 05/17/19 0319  BILITOT  --  0.9  AST  --  24  ALT  --  26  ALKPHOS  --  65  PROT  --  6.2*  ALBUMIN 2.8* 3.2*    Assessment and Plan:  59 y/o M with left hydropneumothorax s/p left chest tube placement in IR 05/19/19 and replacement 05/26/19 seen today for routine follow up.  ~510 cc serosanguineous output in pleurvac on exam today, no obvious air leak appreciated. No concerns per staff. Output ~50 cc in last 24H.  Will plan for CT once OP <20 cc in 24H, per previous note consider CT surgery consult.  IR will continue to follow.  Electronically Signed: Joaquim Nam, PA-C 06/02/2019, 3:17 PM   I spent a total of 15 Minutes at the the patient's bedside AND on the patient's hospital floor or unit, greater than 50% of which was counseling/coordinating care for left chest tube follow up.

## 2019-06-03 LAB — BASIC METABOLIC PANEL
Anion gap: 14 (ref 5–15)
BUN: 26 mg/dL — ABNORMAL HIGH (ref 6–20)
CO2: 30 mmol/L (ref 22–32)
Calcium: 8.8 mg/dL — ABNORMAL LOW (ref 8.9–10.3)
Chloride: 98 mmol/L (ref 98–111)
Creatinine, Ser: 0.5 mg/dL — ABNORMAL LOW (ref 0.61–1.24)
GFR calc Af Amer: >60 mL/min (ref >60)
GFR calc non Af Amer: >60 mL/min (ref >60)
Glucose, Bld: 149 mg/dL — ABNORMAL HIGH (ref 70–99)
Potassium: 3.7 mmol/L (ref 3.5–5.1)
Sodium: 142 mmol/L (ref 135–145)

## 2019-06-03 LAB — NOVEL CORONAVIRUS, NAA (HOSP ORDER, SEND-OUT TO REF LAB; TAT 18-24 HRS): SARS-CoV-2, NAA: NOT DETECTED

## 2019-06-03 NOTE — Progress Notes (Signed)
Referring Physician(s): Hijazi,A  Supervising Physician: Sandi Mariscal  [facility]:  [facility]  Chief Complaint: Cough, dyspnea, left empyema/prior hydropneumothorax   Subjective: Pt resting quietly; has had occ cough/baseline dyspnea   Allergies: Patient has no known allergies.  Medications: Prior to Admission medications   Not on File     Vital Signs: BP 101/64   Pulse (!) 103   Resp 17   SpO2 100%   Physical Exam awake/alert; left chest drain intact, to pleuravac and wall suction;  insertion site ok, not sig tender, OP about 20 cc blood tinged fluid per nurse report; no obvious air leak  Imaging: DG Chest Port 1 View  Result Date: 05/31/2019 CLINICAL DATA:  Chest tube in place for recent empyema EXAM: PORTABLE CHEST 1 VIEW COMPARISON:  May 27, 2019 FINDINGS: Chest tube remains on the left with persistent elevation of left hemidiaphragm. No pneumothorax. There is a small left pleural effusion with patchy airspace opacity in the left mid lower lung zones, stable. A well-defined empyema is currently not appreciable. On the right, there is ill-defined airspace opacity in the right mid lung. Right lung otherwise clear. Heart remains borderline enlarged with mild pulmonary venous hypertension. No adenopathy. Extensive postoperative change in the spine noted. There is calcification in the left carotid artery. IMPRESSION: Chest tube remains on the left without pneumothorax. Persistent elevation of left hemidiaphragm. Airspace opacity on the left is stable without well-defined empyema. New ill-defined opacity in the right mid lung, likely developing pneumonia. Stable cardiac silhouette with mild pulmonary vascular congestion. Electronically Signed   By: Lowella Grip III M.D.   On: 05/31/2019 10:12    Labs:  CBC: Recent Labs    05/23/19 0007 05/26/19 0919 05/29/19 0540 06/02/19 0530  WBC 19.6* 14.8* 15.3* 19.5*  HGB 10.6* 10.2* 9.3* 9.6*  HCT 33.6* 33.5* 30.0*  30.1*  PLT 456* 395 298 139*    COAGS: Recent Labs    05/18/19 0709  INR 1.1    BMP: Recent Labs    05/26/19 0919 05/26/19 0919 05/29/19 0540 05/30/19 0637 06/02/19 0530 06/03/19 0558  NA 137  --  139  --  142 142  K 4.0   < > 3.2* 4.4 2.8* 3.7  CL 94*  --  99  --  98 98  CO2 32  --  29  --  34* 30  GLUCOSE 132*  --  149*  --  138* 149*  BUN 32*  --  32*  --  30* 26*  CALCIUM 8.7*  --  8.5*  --  8.6* 8.8*  CREATININE 0.50*  --  0.52*  --  0.46* 0.50*  GFRNONAA >60  --  >60  --  >60 >60  GFRAA >60  --  >60  --  >60 >60   < > = values in this interval not displayed.    LIVER FUNCTION TESTS: Recent Labs    05/09/19 0805 05/17/19 0319  BILITOT  --  0.9  AST  --  24  ALT  --  26  ALKPHOS  --  65  PROT  --  6.2*  ALBUMIN 2.8* 3.2*    Assessment and Plan: 59 y/o M with left hydropneumothorax/pseudomonal empyema s/p left chest tube placement in IR 05/19/19 and replacement 05/26/19; afebrile; last WBC yesterday 19.5(15.3), hgb 9.6(9.1), creat 0.50; last CXR was on 3/8- no ptx but new opacity rt mid lung concerning for developing PNA; above d/w Dr. Pascal Lux- will plan f/u CT chest  tomorrow   Electronically Signed: D. Jeananne Rama, PA-C 06/03/2019, 10:18 AM   I spent a total of 15 minutes at the the patient's bedside AND on the patient's hospital floor or unit, greater than 50% of which was counseling/coordinating care for left chest tube    Patient ID: Casey Wheeler, male   DOB: 1960-07-07, 59 y.o.   MRN: [id number]

## 2019-06-04 ENCOUNTER — Other Ambulatory Visit (HOSPITAL_COMMUNITY): Payer: Self-pay

## 2019-06-04 MED ORDER — IOHEXOL 300 MG/ML  SOLN
75.0000 mL | Freq: Once | INTRAMUSCULAR | Status: AC | PRN
  Administered 2019-06-04: 75 mL via INTRAVENOUS

## 2019-06-05 ENCOUNTER — Other Ambulatory Visit (HOSPITAL_COMMUNITY): Payer: Self-pay

## 2019-06-05 NOTE — Progress Notes (Signed)
Right chest tube removed in tact at bedside today. No immediate complications, patient tolerated procedure well. EBL: zero  Follow up CXR ordered for 0500 on 3/14 - please order STAT CXR prior to this time if patient develops worsening dyspnea, chest pain, hypoxia, etc.  Please call IR with questions or concerns.  Lynnette Caffey, PA-C

## 2019-06-06 ENCOUNTER — Other Ambulatory Visit (HOSPITAL_COMMUNITY): Payer: Self-pay

## 2019-06-24 DEATH — deceased

## 2021-02-11 IMAGING — DX DG CHEST 1V PORT
1 series · 1 of 1 positions shown · non-contrast
Comparison: CT chest 05/08/2019

CLINICAL DATA: STEMI

EXAM:
PORTABLE CHEST 1 VIEW

[chest]
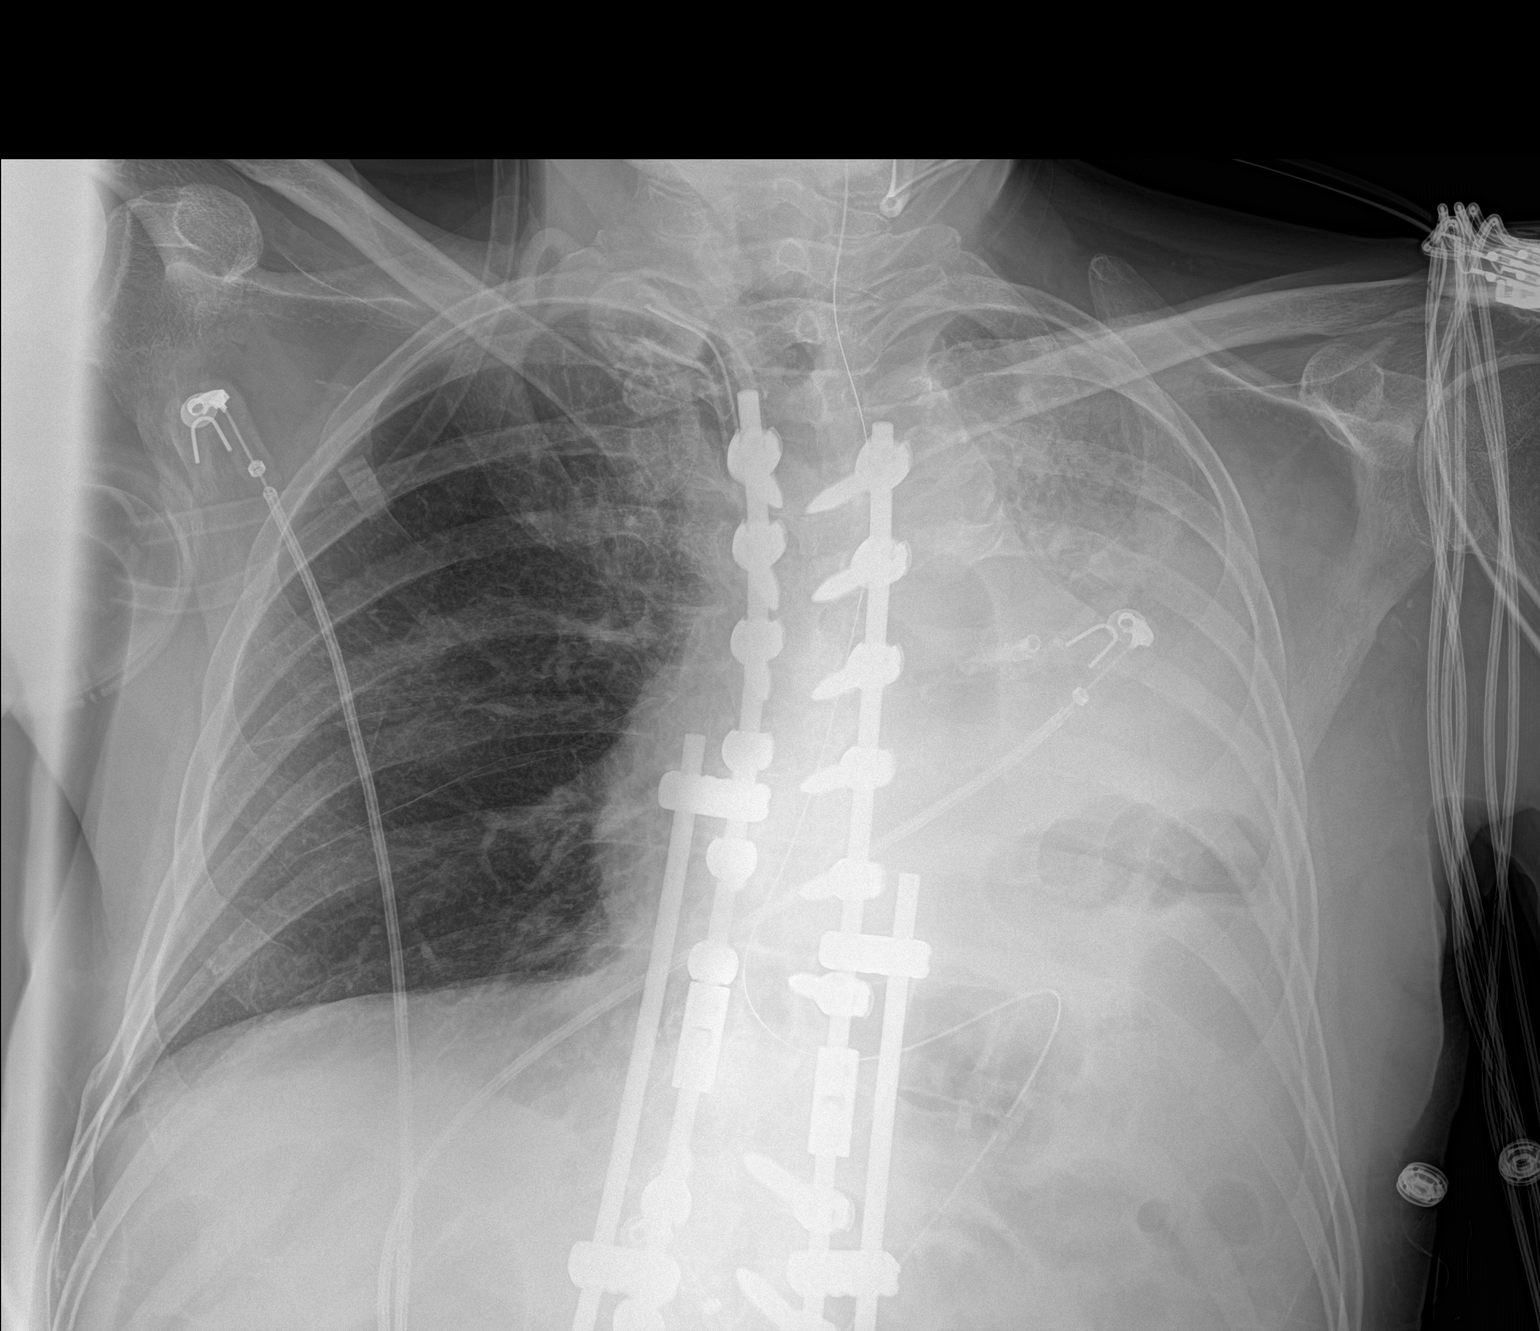

[1 of 1 positions shown; findings below may reference images not displayed]

FINDINGS: Redemonstrated elevation of left hemidiaphragm with a complex left
hydropneumothorax and adjacent passive atelectasis in the remaining
aerated portions of the left lung. Overall appearance is similar to
comparison CT accounting for differences in technique. Right lung is
clear. No right effusion or pneumothorax. Left heart borders are
largely obscured by overlying opacity. Calcified aorta is noted.
Tracheostomy tube terminates in the mid trachea, transesophageal
tube tip and side port distal to the GE junction terminating in the
left upper quadrant. Long segment thoracolumbar fusion hardware is
again noted. Additional support devices and telemetry leads overlie
the chest. No acute osseous or soft tissue abnormality. Subacute to
chronic deformities of the right seventh and eighth rib are again
noted.
IMPRESSION: Tracheostomy and transesophageal tubes are in satisfactory position.

Elevation of left hemidiaphragm with complex left hydropneumothorax,
similar to comparison CT.

Opacity in the left lung is likely combination of passive
atelectasis and layering pleural fluid. Right lung is clear.

Cardiac borders are largely obscured.

Subacute to chronic deformities of the right ribs, stable from
prior.

## 2021-02-14 IMAGING — DX DG CHEST 1V PORT
1 series · 1 of 1 positions shown · non-contrast
Comparison: Chest x-ray dated 05/18/2019 and CT images dated
05/18/2018

CLINICAL DATA: Left hydropneumothorax. Acute on chronic respiratory
failure with hypoxia.

EXAM:
PORTABLE CHEST 1 VIEW

[chest ap]
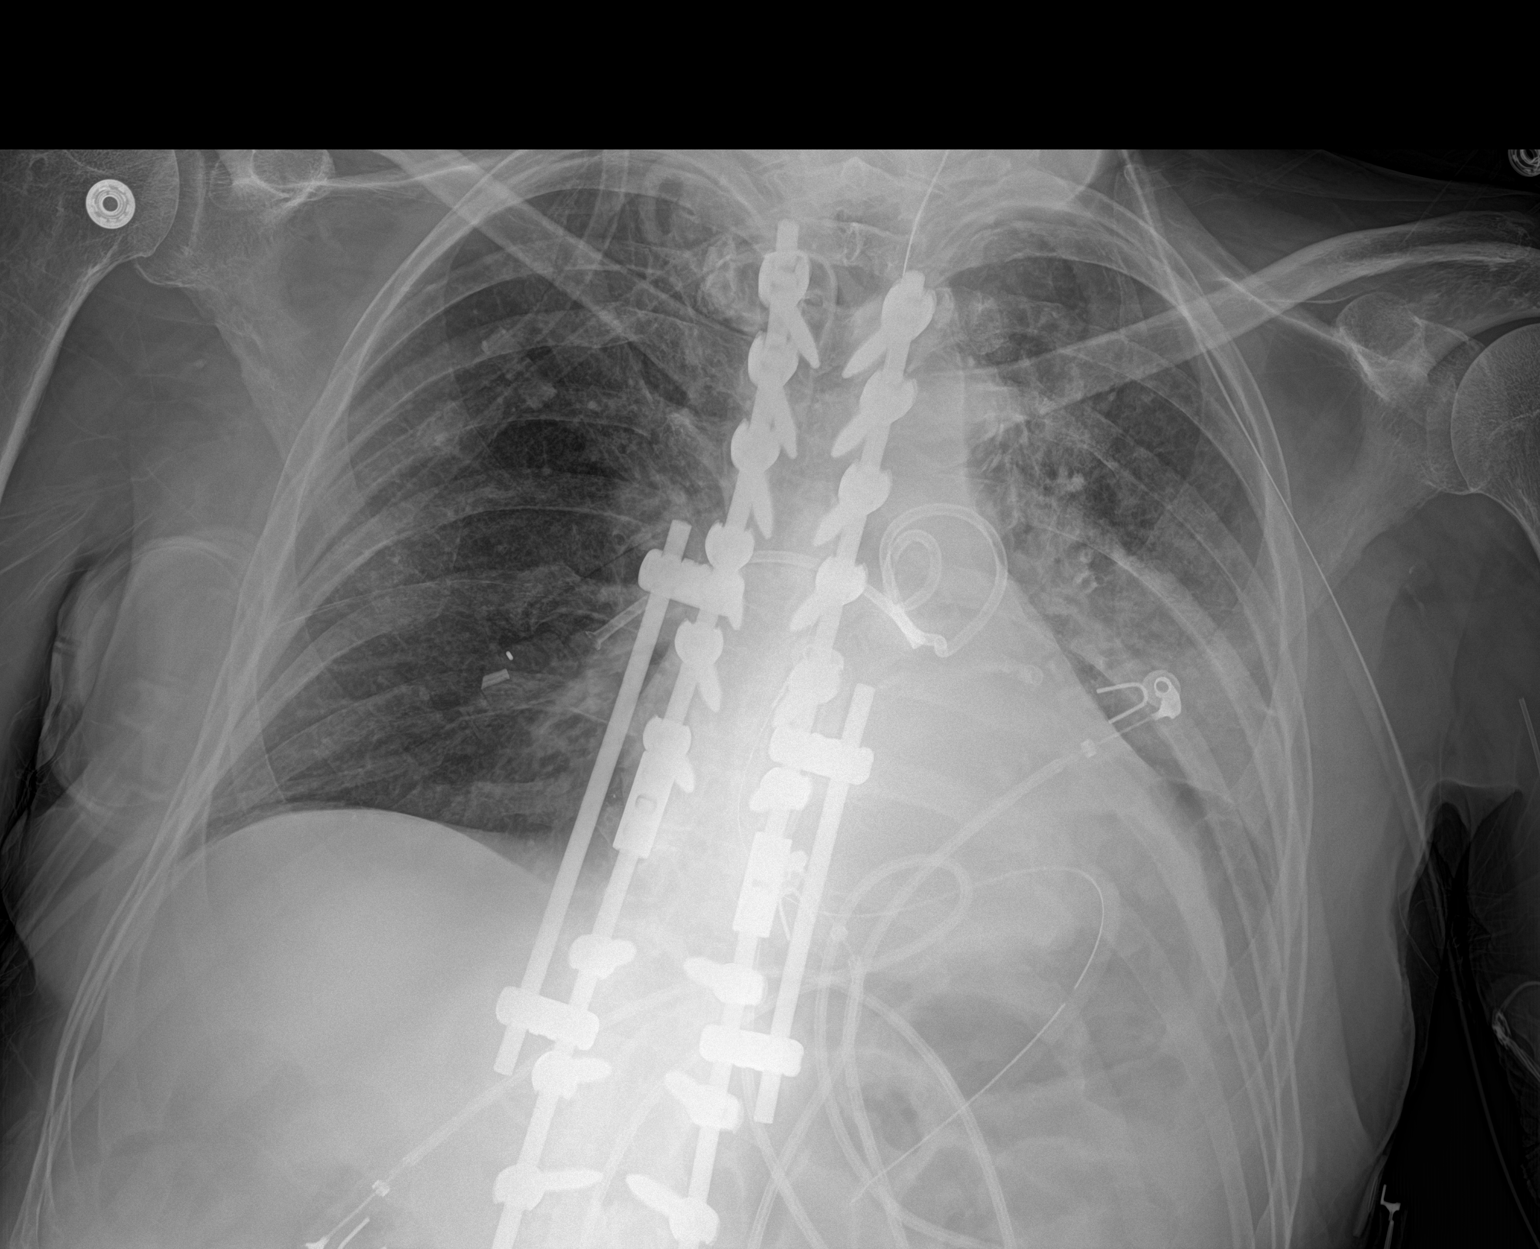

[1 of 1 positions shown; findings below may reference images not displayed]

FINDINGS: The heart size and pulmonary vascularity are normal in the right
lung is clear. A drainage catheter has now been inserted in the
posteromedial aspect of the left hydropneumothorax. There is
persistent atelectasis in the left lower lobe.

No appreciable residual air in pleural space on the left.

Slight elevation of the left hemidiaphragm. NG tube in the stomach.
IMPRESSION: Interval placement of a drainage catheter in the posteromedial
aspect of the left hydropneumothorax. Persistent atelectasis in the
left lower lobe.

1.

## 2021-02-18 IMAGING — CT CT CHEST W/ CM
2 of 3 series · 15 of 36 positions shown, 18 images · IV contrast (Omni 300)
Comparison: May 08, 2019. CT image-guided fluid drained by
catheter dated May 19, 2019.

CLINICAL DATA: Displaced chest tube. Evaluation for residual
pleural fluid. Pleurisy, chest pain and dyspnea.

EXAM:
CT CHEST WITH CONTRAST
TECHNIQUE: Multidetector CT imaging of the chest was performed during
intravenous contrast administration. Automatic exposure control
utilized.
CONTRAST:  75mL OMNIPAQUE IOHEXOL 300 MG/ML  SOLN

[Series 3: chest with 2mm st · axial · 0.72mm/px · z∈[+1018,+1272]mm · 12 of 151 slices shown, 15 images]
[im 12/151  mediastinal]
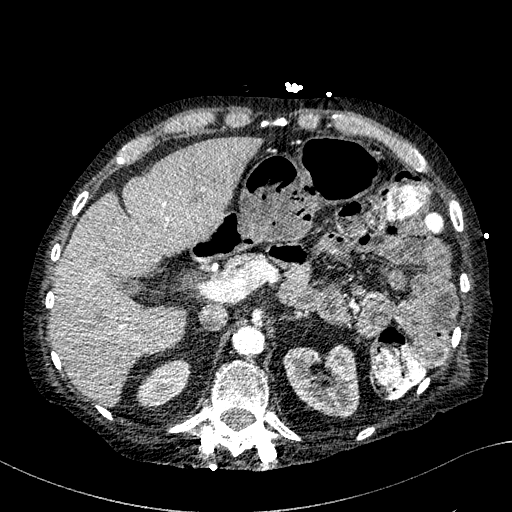
[im 12/151  lung]
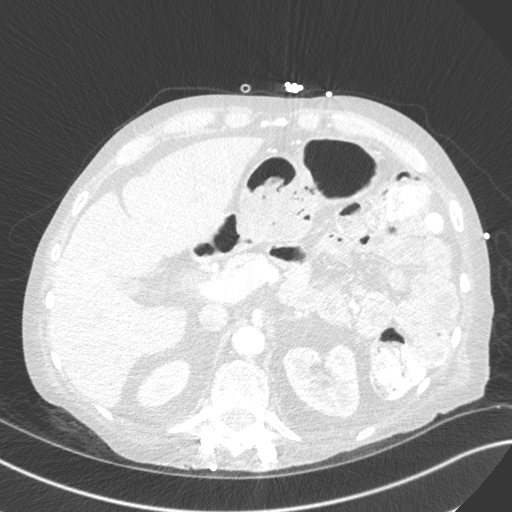
[im 23/151  lung]
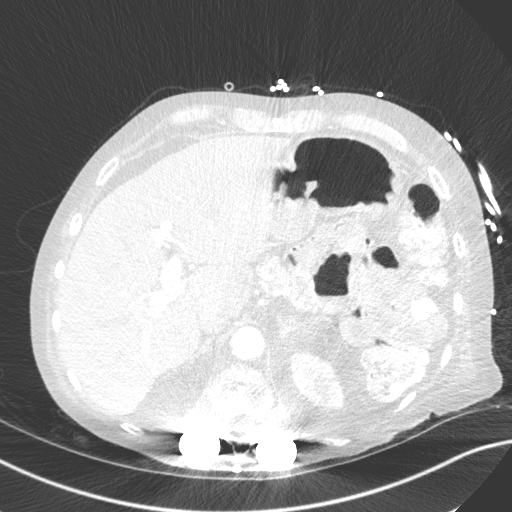
[im 34/151  lung]
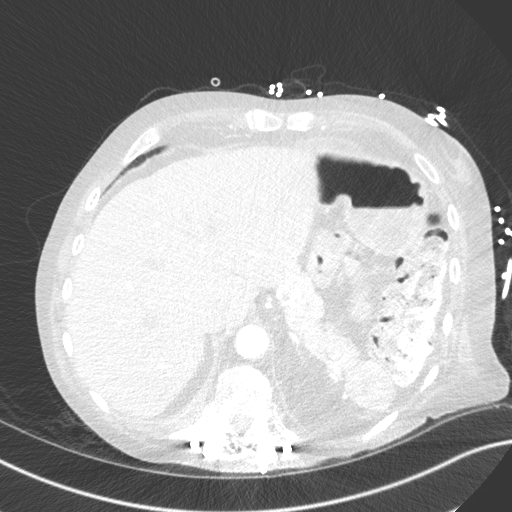
[im 45/151  lung]
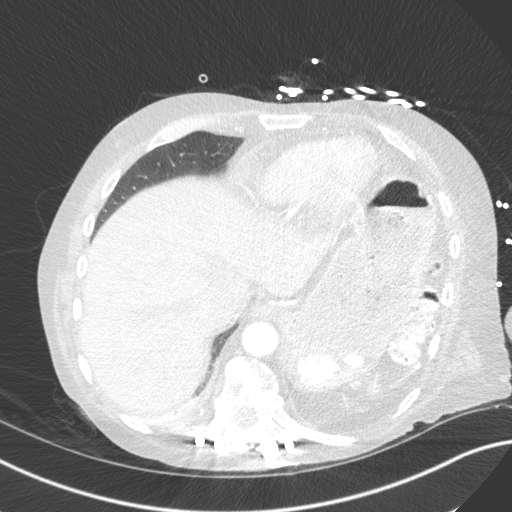
[im 56/151  mediastinal]
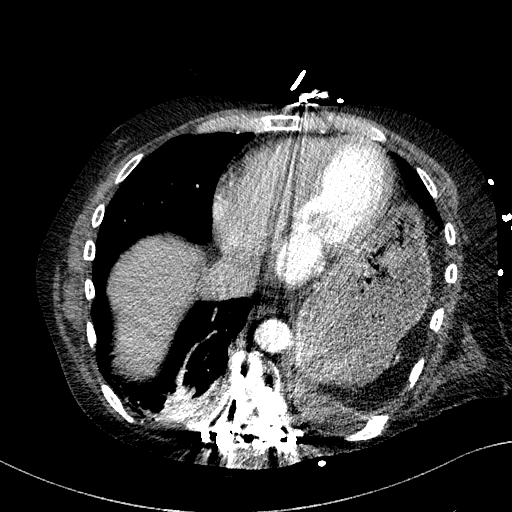
[im 56/151  lung]
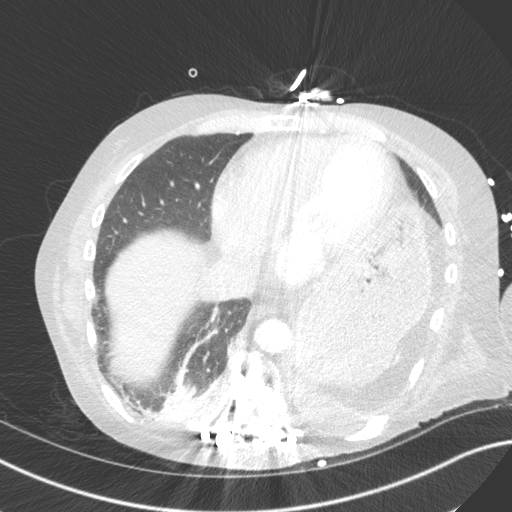
[im 67/151  lung]
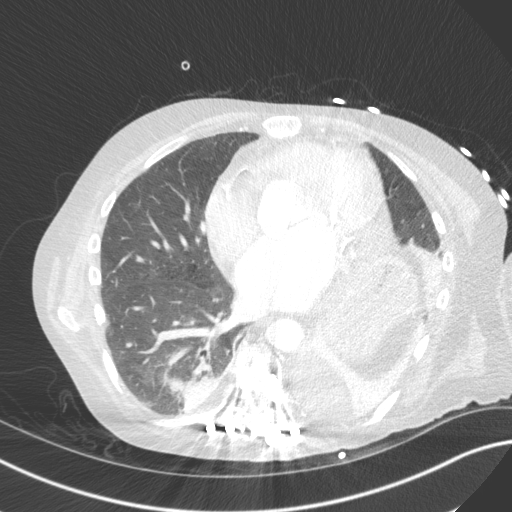
[im 84/151  lung]
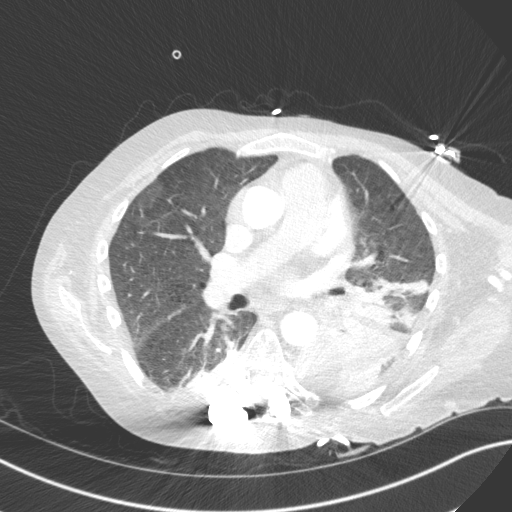
[im 95/151  lung]
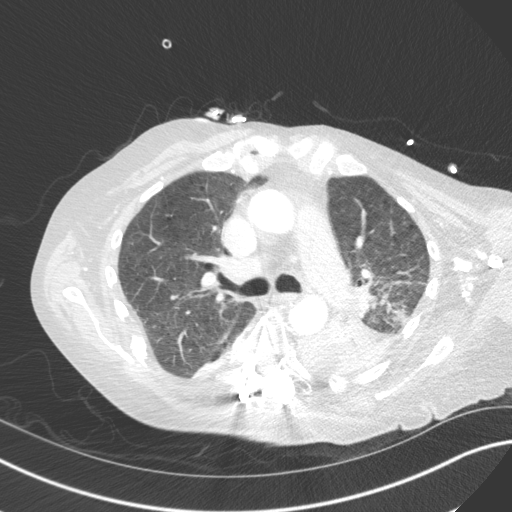
[im 106/151  mediastinal]
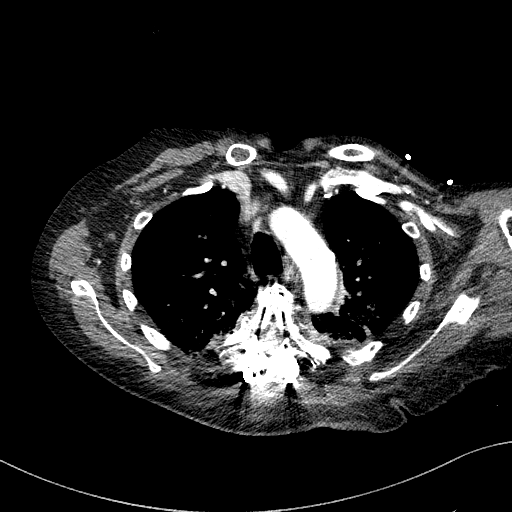
[im 106/151  lung]
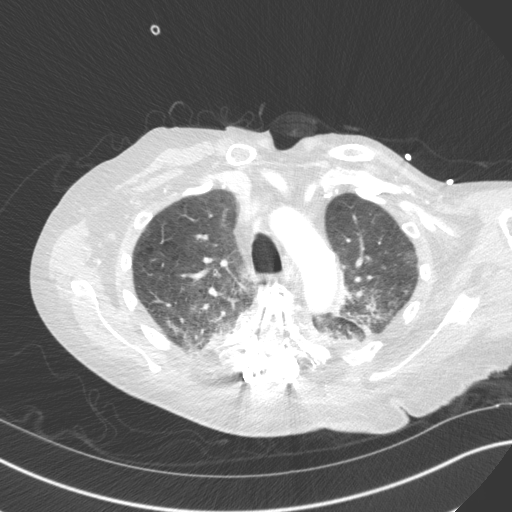
[im 117/151  lung]
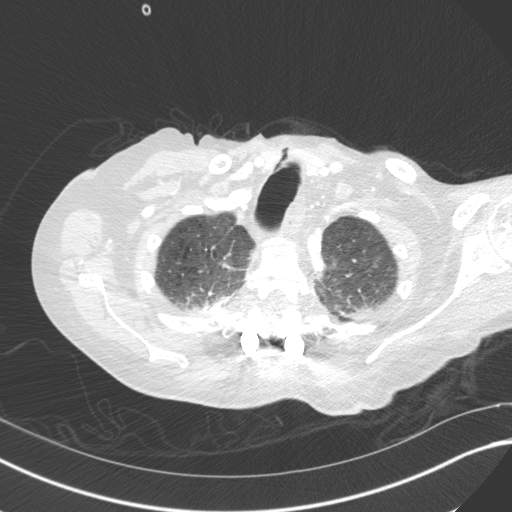
[im 128/151  lung]
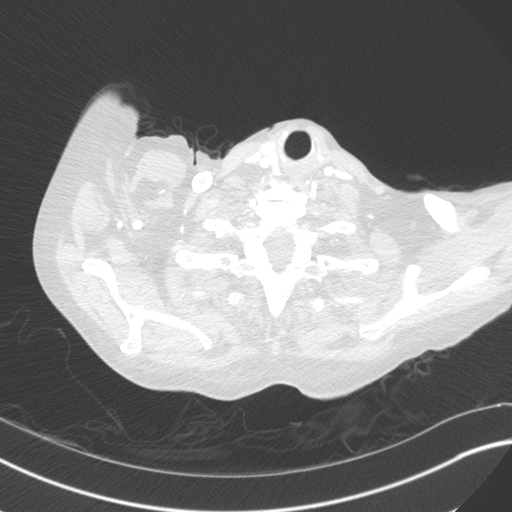
[im 139/151  lung]
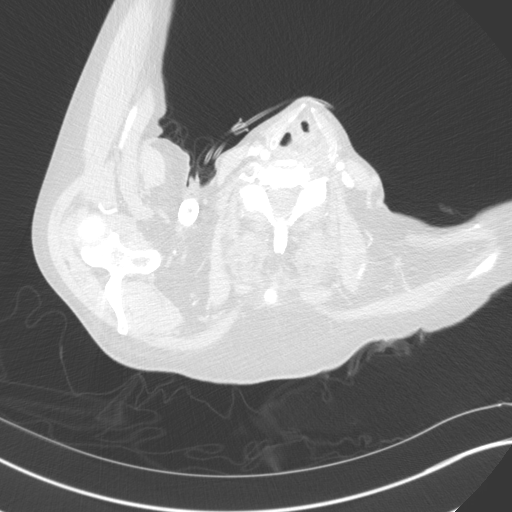

[Series 6: chest with 2mm st cor · coronal · 0.62mm/px · 3 of 136 slices shown]
[im 28/136  lung]
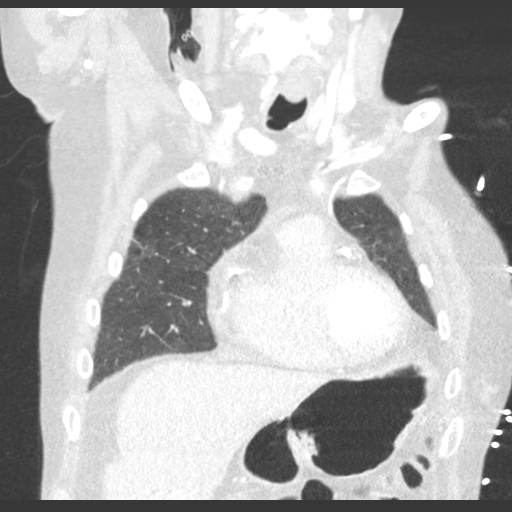
[im 55/136  lung]
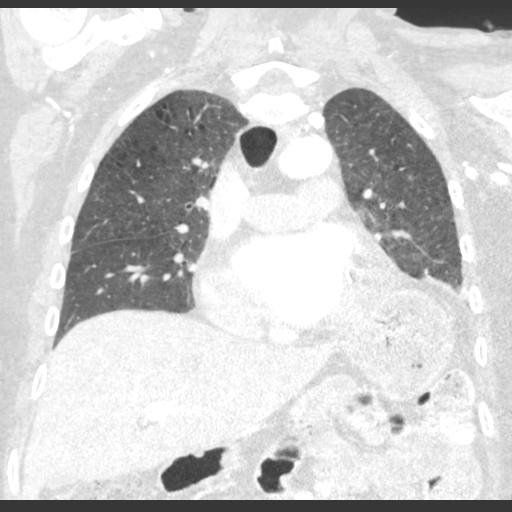
[im 82/136  lung]
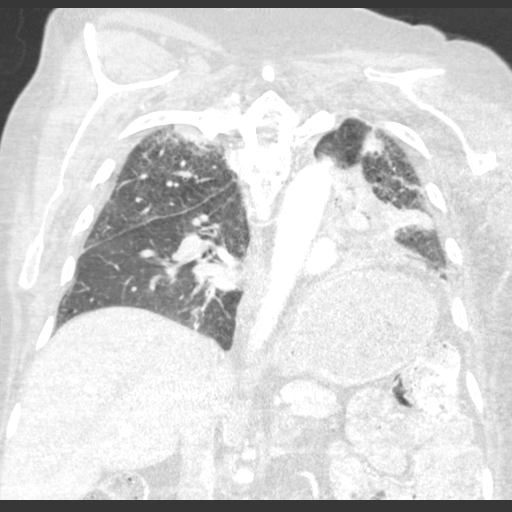

[15 of 36 positions shown; findings below may reference images not displayed]

FINDINGS: Cardiovascular: Mild cardiomegaly. Coarse mitral annulus
calcification. Severe left main and LAD coronary calcification. Mild
left circumflex and right main coronary calcification. Mild aortic
valvular calcification and thoracic aorta calcified atherosclerosis
without aneurysmal dilatation. 2.9 cm pulmonary trunk diameter,
consistent with pulmonary artery hypertension. Moderate calcified
atherosclerosis of the left carotid bulb and origin of the left
vertebral artery.

Mediastinum/Nodes: No adenopathy. Remote tracheostomy tract with
interval removal of the tracheostomy tube. Interval tracheostomy
removal.

Lungs/Pleura: Enhancing atelectasis in the lung bases and dependent
lingula. Similar moderate centrilobular emphysema. Mild bilateral
nonspecific bronchitis. No pneumothorax or pulmonary edema. Trace
residual dependent right pleural fluid, similar to [DATE] and
24, 5650. Small residual left pleural fluid collection, it is
Hounsfield unit measurement degraded by adjacent spinal
stabilization hardware artifact, measuring 6 x 3 x 9 cm in greatest
dimension anteroposteriorly by transversely by craniocaudal length,
minimally increased in size without residual air density.

Upper Abdomen: Incidental finding of an 8 mm left adrenal nodule,
likely a benign adenoma based on size criteria; no additional
imaging is recommended given size.

Musculoskeletal: Redemonstration of chronic bilateral rib fracture
nonunion deformities and healed rib fractures, thoraco-lumbar spinal
rod and posterior spinal stabilization hardware. Chronic moderate T8
and T10 30% loss of vertebral body heights along the inferior
endplates. Grade 1 retrolisthesis of C6 on C7, probably
degenerative. Benign sternal foramen. Moderate skeletal degenerative
change.
IMPRESSION: Minimally increased size of a small left pleural fluid collection
compared to [DATE] and 24, 5650. Similar trace right pleural
fluid.

Aortic Atherosclerosis (1DLMG-YFZ.Z) and Emphysema (1DLMG-U9L.6).

Four-vessel coronary calcification, severe along the LAD and left
main coronary arteries.

Pulmonary artery hypertension.

Similar moderate centrilobular emphysema with mild bilateral
bronchitis and compressive atelectasis in both lungs.

Chronic skeletal postoperative, sequela of remote posttraumatic, and
degenerative changes.

## 2021-02-21 IMAGING — DX DG CHEST 1V PORT
1 series · 1 of 1 positions shown · non-contrast
Comparison: CT chest 05/24/2019.  One-view chest x-ray 05/22/2019.

CLINICAL DATA: Empyema of the pleural space.  Chest tube.

EXAM:
PORTABLE CHEST 1 VIEW

[chest ap]
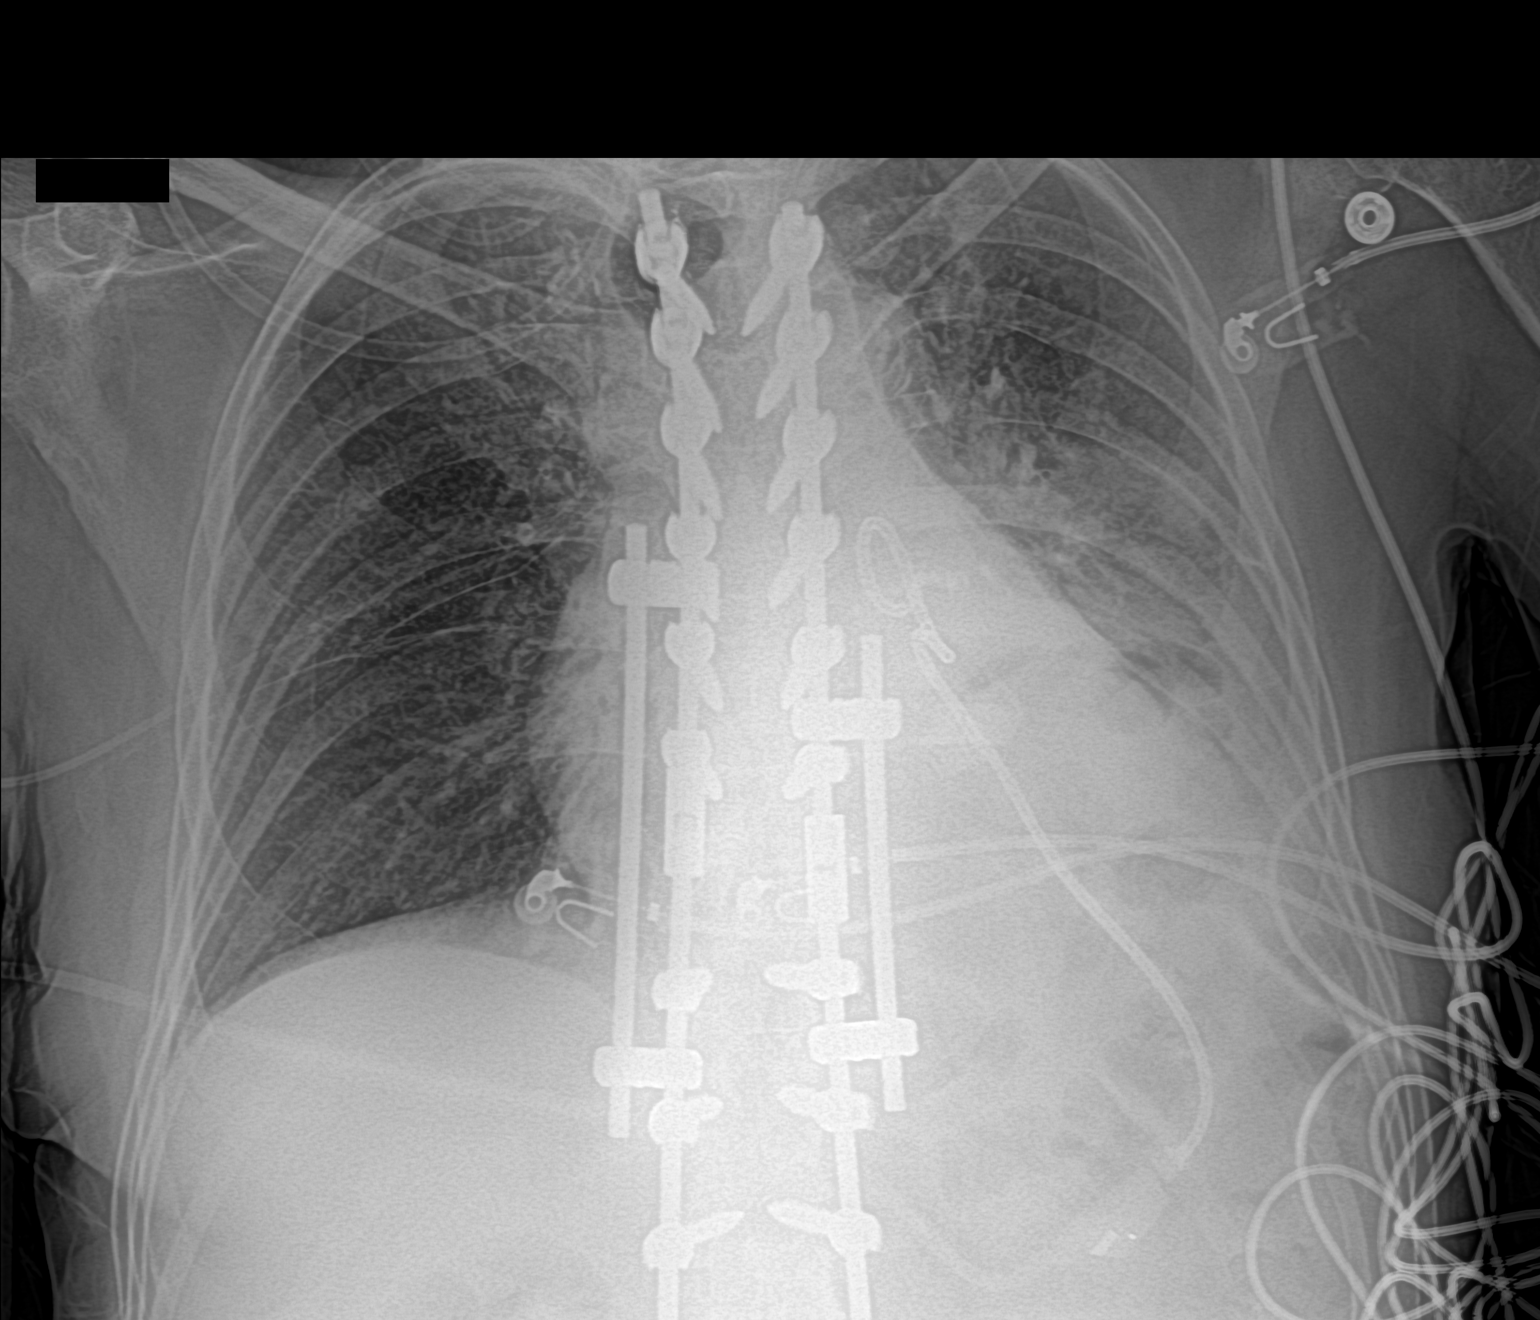

[1 of 1 positions shown; findings below may reference images not displayed]

FINDINGS: Left-sided chest tube remains in place. Left hemidiaphragm is
elevated. Left basilar airspace disease is otherwise unchanged. Mild
pulmonary vascular congestion is present. The right lung is clear.
Extensive spinal fusion is again noted.
IMPRESSION: 1. Left-sided chest tube in place.
2. No significant change in left lower lobe opacities representing a
combination of airspace disease and pleural fluid.
3. Elevated left hemidiaphragm.
